# Patient Record
Sex: Male | Born: 1955 | Race: White | Hispanic: No | Smoking: Former smoker
Health system: Southern US, Community
[De-identification: ages and names within clinical notes are randomized; demographics above are authoritative.]

## PROBLEM LIST (undated history)

## (undated) DIAGNOSIS — I10 Essential (primary) hypertension: Secondary | ICD-10-CM

## (undated) HISTORY — PX: HAND SURGERY: SHX662

## (undated) HISTORY — PX: LEG SURGERY: SHX1003

## (undated) HISTORY — PX: HERNIA REPAIR: SHX51

## (undated) HISTORY — PX: MANDIBLE FRACTURE SURGERY: SHX706

## (undated) HISTORY — PX: OTHER SURGICAL HISTORY: SHX169

---

## 1999-03-02 ENCOUNTER — Encounter: Payer: Self-pay | Admitting: Emergency Medicine

## 1999-03-02 ENCOUNTER — Emergency Department (HOSPITAL_COMMUNITY): Admission: EM | Admit: 1999-03-02 | Discharge: 1999-03-02 | Payer: Self-pay | Admitting: Emergency Medicine

## 2000-05-02 ENCOUNTER — Other Ambulatory Visit: Admission: RE | Admit: 2000-05-02 | Discharge: 2000-05-02 | Payer: Self-pay | Admitting: Orthopedic Surgery

## 2001-07-05 ENCOUNTER — Ambulatory Visit (HOSPITAL_COMMUNITY): Admission: RE | Admit: 2001-07-05 | Discharge: 2001-07-05 | Payer: Self-pay | Admitting: Otolaryngology

## 2001-07-05 ENCOUNTER — Encounter: Payer: Self-pay | Admitting: Otolaryngology

## 2002-10-30 ENCOUNTER — Ambulatory Visit: Admission: RE | Admit: 2002-10-30 | Discharge: 2002-10-30 | Payer: Self-pay | Admitting: Orthopedic Surgery

## 2002-12-25 ENCOUNTER — Encounter (INDEPENDENT_AMBULATORY_CARE_PROVIDER_SITE_OTHER): Payer: Self-pay | Admitting: *Deleted

## 2002-12-25 ENCOUNTER — Observation Stay (HOSPITAL_COMMUNITY): Admission: RE | Admit: 2002-12-25 | Discharge: 2002-12-26 | Payer: Self-pay | Admitting: Orthopedic Surgery

## 2007-09-16 ENCOUNTER — Emergency Department (HOSPITAL_COMMUNITY): Admission: EM | Admit: 2007-09-16 | Discharge: 2007-09-17 | Payer: Self-pay | Admitting: Emergency Medicine

## 2010-12-31 NOTE — Op Note (Signed)
NAME:  Ronald Sosa, Ronald Sosa                       ACCOUNT NO.:  [id number]   MEDICAL RECORD NO.:  [id number]                   PATIENT TYPE:  OBV   LOCATION:  0454                                 FACILITY:  [facility]   PHYSICIAN:  Georges Lynch. Darrelyn Hillock, M.D.             DATE OF BIRTH:  05/22/1956   DATE OF PROCEDURE:  12/25/2002  DATE OF DISCHARGE:                                 OPERATIVE REPORT   SURGEON:  Georges Lynch. Darrelyn Hillock, M.D.   ASSISTANT:  Ebbie Ridge. Paitsel, P.A.   PREOPERATIVE DIAGNOSIS:  Ganglion cyst of the posterior leg proximal central  on the right.   POSTOPERATIVE DIAGNOSIS:  Ganglion cyst of the posterior leg proximal  central on the right.   OPERATION:  Exploration of the popliteal space and distal to the space down  into the soleus muscle. We were deep to the muscle and basically biopsies  were taken of the area.   DESCRIPTION OF PROCEDURE:  Under general anesthesia, routine orthopedic prep  and draping was carried out with the patient in prone position. No  tourniquet was used so that we could palpate the vessel. These cysts were  sort of septated and localized directly posterior to the tibial artery, vein  and nerve. We made the incision down between the two heads of the gastroc  muscle which started at the knee joint and dissected down distally. We  identified the tibial nerve, posterior tibial artery and vein and identified  all the branches of the tibial nerve. Great care was taken not to injure the  neurological structures. We went down distal to the soleus ring where the  artery goes down. We took biopsies of the area, also took biopsies of local  fat and sent those off to the lab. We thoroughly irrigated out the area,  closed the wound in layers in the usual fashion. Basically the cyst that he  had mostly likely was ruptured but there was no large masses there and went  all the way down to the mid calf region deep. We will go ahead and repeat an  MRI later to make  sure they do not fill again with fluid, if they do and if  he is symptomatic we may have to go back, but I doubt that. In fact, he said  that when I first saw him he could not flex his toes and has definitely  improved since the time of MRI. Basically we will keep him on antibiotics,  put him in the immobilizer and send him home tomorrow.                                               Ronald A. Darrelyn Hillock, M.D.    RAG/MEDQ  D:  12/25/2002  T:  12/26/2002  Job:  [id number]

## 2011-05-06 LAB — CBC
HCT: 32.6 — ABNORMAL LOW
Hemoglobin: 11 — ABNORMAL LOW
MCHC: 33.7
MCV: 84.2
Platelets: 318
RBC: 3.87 — ABNORMAL LOW
RDW: 14.9
WBC: 6.6

## 2011-05-06 LAB — DIFFERENTIAL
Basophils Absolute: 0.1
Basophils Relative: 1
Eosinophils Absolute: 0.3
Eosinophils Relative: 5
Lymphocytes Relative: 23
Lymphs Abs: 1.5
Monocytes Absolute: 0.9
Monocytes Relative: 14 — ABNORMAL HIGH
Neutro Abs: 3.8
Neutrophils Relative %: 58

## 2011-05-06 LAB — BASIC METABOLIC PANEL
BUN: 10
CO2: 28
Calcium: 9.1
Chloride: 105
Creatinine, Ser: 0.79
GFR calc Af Amer: >60
GFR calc non Af Amer: >60
Glucose, Bld: 128 — ABNORMAL HIGH
Potassium: 3.4 — ABNORMAL LOW
Sodium: 138

## 2011-05-06 LAB — POCT CARDIAC MARKERS
Myoglobin, poc: 181
Troponin i, poc: <0.05

## 2011-05-06 LAB — OCCULT BLOOD X 1 CARD TO LAB, STOOL: Fecal Occult Bld: NEGATIVE

## 2014-03-23 ENCOUNTER — Emergency Department (HOSPITAL_COMMUNITY)
Admission: EM | Admit: 2014-03-23 | Discharge: 2014-03-23 | Disposition: A | Discharge status: Home / Self Care | Payer: Managed Care, Other (non HMO) | Attending: Emergency Medicine | Admitting: Emergency Medicine

## 2014-03-23 ENCOUNTER — Encounter (HOSPITAL_COMMUNITY): Payer: Self-pay | Admitting: Emergency Medicine

## 2014-03-23 DIAGNOSIS — R030 Elevated blood-pressure reading, without diagnosis of hypertension: Secondary | ICD-10-CM | POA: Insufficient documentation

## 2014-03-23 DIAGNOSIS — Z87891 Personal history of nicotine dependence: Secondary | ICD-10-CM | POA: Insufficient documentation

## 2014-03-23 DIAGNOSIS — IMO0001 Reserved for inherently not codable concepts without codable children: Secondary | ICD-10-CM

## 2014-03-23 LAB — CBC WITH DIFFERENTIAL/PLATELET
BASOS PCT: 1 % (ref 0–1)
Basophils Absolute: 0 10*3/uL (ref 0.0–0.1)
EOS ABS: 0.2 10*3/uL (ref 0.0–0.7)
EOS PCT: 5 % (ref 0–5)
HCT: 44.6 % (ref 39.0–52.0)
Hemoglobin: 15.2 g/dL (ref 13.0–17.0)
LYMPHS ABS: 1.2 10*3/uL (ref 0.7–4.0)
Lymphocytes Relative: 24 % (ref 12–46)
MCH: 30.8 pg (ref 26.0–34.0)
MCHC: 34.1 g/dL (ref 30.0–36.0)
MCV: 90.5 fL (ref 78.0–100.0)
MONOS PCT: 15 % — AB (ref 3–12)
Monocytes Absolute: 0.8 10*3/uL (ref 0.1–1.0)
Neutro Abs: 2.8 10*3/uL (ref 1.7–7.7)
Neutrophils Relative %: 55 % (ref 43–77)
PLATELETS: 247 10*3/uL (ref 150–400)
RBC: 4.93 MIL/uL (ref 4.22–5.81)
RDW: 13.6 % (ref 11.5–15.5)
WBC: 5.1 10*3/uL (ref 4.0–10.5)

## 2014-03-23 LAB — COMPREHENSIVE METABOLIC PANEL
ALT: 9 U/L (ref 0–53)
ANION GAP: 10 (ref 5–15)
AST: 18 U/L (ref 0–37)
Albumin: 3.7 g/dL (ref 3.5–5.2)
Alkaline Phosphatase: 70 U/L (ref 39–117)
BUN: 15 mg/dL (ref 6–23)
CALCIUM: 9.6 mg/dL (ref 8.4–10.5)
CO2: 28 mEq/L (ref 19–32)
Chloride: 104 mEq/L (ref 96–112)
Creatinine, Ser: 0.76 mg/dL (ref 0.50–1.35)
GFR calc non Af Amer: >90 mL/min (ref >90)
GLUCOSE: 110 mg/dL — AB (ref 70–99)
Potassium: 4.1 mEq/L (ref 3.7–5.3)
SODIUM: 142 meq/L (ref 137–147)
TOTAL PROTEIN: 7.3 g/dL (ref 6.0–8.3)
Total Bilirubin: 0.6 mg/dL (ref 0.3–1.2)

## 2014-03-23 MED ORDER — CLONIDINE HCL 0.1 MG PO TABS
0.2000 mg | ORAL_TABLET | Freq: Once | ORAL | Status: AC
  Administered 2014-03-23: 0.2 mg via ORAL
  Filled 2014-03-23: qty 2

## 2014-03-23 MED ORDER — LISINOPRIL 5 MG PO TABS: 5.0000 mg | ORAL_TABLET | Freq: Every day | ORAL | Status: DC

## 2014-03-23 NOTE — ED Notes (Signed)
Pt arrived to the ED with a complaint of hypertension.  Pt states he was at the Nursing home visiting with his wife when he fell asleep.  Pt left Nursing facility around 0130 and didn't feel well.  Pt went to the for wound care when he was told his BP was elevated.  Pt took his blood pressure at home, it was elevated, he came to ED for evaluation.

## 2014-03-23 NOTE — Discharge Instructions (Signed)
Establish primary care M.D. to manage your blood pressure. Take medication as prescribed. Return immediately to the emergency apartment for vision changes, persistent vomiting, focal weakness or numbness or for any concerns.   Hypertension Hypertension, commonly called high blood pressure, is when the force of blood pumping through your arteries is too strong. Your arteries are the blood vessels that carry blood from your heart throughout your body. A blood pressure reading consists of a higher number over a lower number, such as 110/72. The higher number (systolic) is the pressure inside your arteries when your heart pumps. The lower number (diastolic) is the pressure inside your arteries when your heart relaxes. Ideally you want your blood pressure below 120/80. Hypertension forces your heart to work harder to pump blood. Your arteries may become narrow or stiff. Having hypertension puts you at risk for heart disease, stroke, and other problems.  RISK FACTORS Some risk factors for high blood pressure are controllable. Others are not.  Risk factors you cannot control include:   Race. You may be at higher risk if you are African American.  Age. Risk increases with age.  Gender. Men are at higher risk than women before age 46 years. After age 66, women are at higher risk than men. Risk factors you can control include:  Not getting enough exercise or physical activity.  Being overweight.  Getting too much fat, sugar, calories, or salt in your diet.  Drinking too much alcohol. SIGNS AND SYMPTOMS Hypertension does not usually cause signs or symptoms. Extremely high blood pressure (hypertensive crisis) may cause headache, anxiety, shortness of breath, and nosebleed. DIAGNOSIS  To check if you have hypertension, your health care provider will measure your blood pressure while you are seated, with your arm held at the level of your heart. It should be measured at least twice using the same arm.  Certain conditions can cause a difference in blood pressure between your right and left arms. A blood pressure reading that is higher than normal on one occasion does not mean that you need treatment. If one blood pressure reading is high, ask your health care provider about having it checked again. TREATMENT  Treating high blood pressure includes making lifestyle changes and possibly taking medicine. Living a healthy lifestyle can help lower high blood pressure. You may need to change some of your habits. Lifestyle changes may include:  Following the DASH diet. This diet is high in fruits, vegetables, and whole grains. It is low in salt, red meat, and added sugars.  Getting at least 2 hours of brisk physical activity every week.  Losing weight if necessary.  Not smoking.  Limiting alcoholic beverages.  Learning ways to reduce stress. If lifestyle changes are not enough to get your blood pressure under control, your health care provider may prescribe medicine. You may need to take more than one. Work closely with your health care provider to understand the risks and benefits. HOME CARE INSTRUCTIONS  Have your blood pressure rechecked as directed by your health care provider.   Take medicines only as directed by your health care provider. Follow the directions carefully. Blood pressure medicines must be taken as prescribed. The medicine does not work as well when you skip doses. Skipping doses also puts you at risk for problems.   Do not smoke.   Monitor your blood pressure at home as directed by your health care provider. SEEK MEDICAL CARE IF:   You think you are having a reaction to medicines taken.  You have recurrent headaches or feel dizzy.  You have swelling in your ankles.  You have trouble with your vision. SEEK IMMEDIATE MEDICAL CARE IF:  You develop a severe headache or confusion.  You have unusual weakness, numbness, or feel faint.  You have severe chest or  abdominal pain.  You vomit repeatedly.  You have trouble breathing. MAKE SURE YOU:   Understand these instructions.  Will watch your condition.  Will get help right away if you are not doing well or get worse. Document Released: 08/01/2005 Document Revised: 12/16/2013 Document Reviewed: 05/24/2013 [facility] Patient Information 2015 [place], [place]. This information is not intended to replace advice given to you by your health care provider. Make sure you discuss any questions you have with your health care provider.  Emergency Department Resource Guide 1) Find a Doctor and Pay Out of Pocket Although you won't have to find out who is covered by your insurance plan, it is a good idea to ask around and get recommendations. You will then need to call the office and see if the doctor you have chosen will accept you as a new patient and what types of options they offer for patients who are self-pay. Some doctors offer discounts or will set up payment plans for their patients who do not have insurance, but you will need to ask so you aren't surprised when you get to your appointment.  2) Contact Your Local Health Department Not all health departments have doctors that can see patients for sick visits, but many do, so it is worth a call to see if yours does. If you don't know where your local health department is, you can check in your phone book. The CDC also has a tool to help you locate your state's health department, and many state websites also have listings of all of their local health departments.  3) Find a [facility] If your illness is not likely to be very severe or complicated, you may want to try a walk in clinic. These are popping up all over the country in pharmacies, drugstores, and shopping centers. They're usually staffed by nurse practitioners or physician assistants that have been trained to treat common illnesses and complaints. They're usually fairly quick and inexpensive.  However, if you have serious medical issues or chronic medical problems, these are probably not your best option.  No Primary Care Doctor: - Call Health Connect at  [phone] - they can help you locate a primary care doctor that  accepts your insurance, provides certain services, etc. - Physician Referral Service- [phone]  Chronic Pain Problems: Organization         Address  Phone   Notes  [facility]  [phone] Patients need to be referred by their primary care doctor.   Medication Assistance: Organization         Address  Phone   Notes  [facility] Medication [facility] [place], [place] [address] [phone] --Must be a resident of [facility] -- Must have NO insurance coverage whatsoever (no Medicaid/ Medicare, etc.) -- The pt. MUST have a primary care doctor that directs their care regularly and follows them in the community   Med[facility]ist  [phone]   [employer]  [phone]    Agencies that provide inexpensive medical care: Organization         Address  Phone   Notes  [place]  [phone]  [facility]    [phone]   [place] [facility] [place], [place] [address] [phone]   [place]. [address], [place] [phone]   Planned Parenthood    [phone]   [facility]    [phone]   [place] and [place] [address], [place] Phone:  [phone], Fax:  [phone] Hours of Operation:  9 am - 6 pm, M-F.  Also accepts Medicaid/Medicare and self-pay.  [facility] for [address], [address], [place] Phone: [phone], Fax: [phone]. Hours of Operation:  8:30 am - 5:30 pm, M-F.  Also accepts Medicaid and self-pay.  [facility] [place] [address], [place] Phone: [phone]   [place], [place], [place] [phone], Ext. [phone] Mondays & Thursdays: 7-9 AM.  First 15 patients are seen on a first come, first serve basis.    [place] Providers:  Organization         Address  Phone   Notes  [facility] [address], [place] [phone] Also accepts self-pay patients.  [facility] [address], [place]  [phone]   [place], [address], [place] [phone]   [facility] [address], [place] [phone]   Lucianne Lei [address], [place]   [phone] Only accepts [place] Access [place] patients after they have their name applied to their card.   Self-Pay (no insurance) in [facility]:  Organization         Address  Phone   Notes  Sickle Cell Patients, [facility] [place] [phone]   [facility]gent Care [place] [phone]   [facility] [place]  [place], [place], [place] [phone]   [facility]/Dr. Osei-Bonsu  [address] [place] or [address], [place] [phone] Phone number for both [place] and [place] locations is the same.  Urgent Medical and [facility] [address], [place] [phone]   [facility] [address], [place] or [address] [phone]   [facility] [address], [place] [phone], phone; [phone], fax Sees patients 1st and 3rd Saturday of every month.  Must not qualify for public or private insurance (i.e. Medicaid, Medicare, [place] Health Choice, Veterans' Benefits)  Household income should be no more than 200% of the poverty level The clinic cannot treat you if you are pregnant or think  you are pregnant  Sexually transmitted diseases are not treated at the clinic.    Dental Care: Organization         Address  Phone  Notes  [facility] Department of [facility] [place] [phone] Accepts children up to age 60 who are enrolled in [place] or [place]; pregnant women with a Medicaid card; and children who have applied for Medicaid or [place] Health Choice, but were declined, whose parents can pay a reduced fee at time of service.  [facility] Department of [facility]  [address],  [place] [phone] Accepts children up to age 7 who are enrolled in Medicaid or [facility] Health Choice; pregnant women with a Medicaid card; and children who have applied for Medicaid or  Health Choice, but were declined, whose parents can pay a reduced fee at time of service.  [place] Adult Dental Access PROGRAM  [place] [phone] Patients are seen by appointment only. Walk-ins are not accepted. [place] will see patients 57 years of age and older. Monday - Tuesday (8am-5pm) Most Wednesdays (8:30-5pm) $30 per visit, cash only  [facility] Adult Dental Access PROGRAM  [address], [facility] [phone] Patients are seen by appointment only. Walk-ins are not accepted. [place] will see patients 20 years of age and older. One Wednesday Evening (Monthly: Volunteer Based).  $30 per visit, cash only  [place]  [phone] for adults; Children under age 67, call Graduate Pediatric Dentistry at [phone]. Children aged 53-14, please call [phone] to request a pediatric application.  Dental services are provided in all areas of dental care including fillings, crowns and bridges, complete and partial dentures, implants, gum treatment, root canals, and extractions. Preventive care is also provided. Treatment is provided to both adults and  children. Patients are selected via a lottery and there is often a waiting list.   [facility] [address], [place]  [phone] [email or web address]   [facility] [address] [place], [place] [phone], Ext. [phone] Second and Fourth Thursday of each month, opens at 6:30 AM; Clinic ends at 9 AM.  Patients are seen on a first-come first-served basis, and a limited number are seen during each clinic.   [facility]  [address] [place], [place] [phone]   Eligibility Requirements You must have lived in [place], [place], or [place] counties for at least the last three months.   You cannot be eligible for state or federal sponsored [employer], including [employer], [place], or [employer].   You generally cannot be eligible for healthcare insurance through your employer.    How to apply: Eligibility screenings are held every Tuesday and Wednesday afternoon from 1:00 pm until 4:00 pm. You do not need an appointment for the interview!  [facility] [address], [place], [place]   [place]  [place] Department  [place]  [phone]    Behavioral Health Resources in the Community: Intensive Outpatient Programs Organization         Address  Phone  Notes  [place]. [address] [place], [place] [phone]   [facility] Outpatient [address], [place], [place]   ADS: Alcohol & Drug Svcs [address] [place], [place]   [place] [address],  [place], [place] or [phone]   Substance Abuse Resources Organization         Address  Phone  Notes  Alcohol and Drug Services  [phone]   [place]  [phone]   [facility] [place]     Chinita Pester  [phone]   Residential & Outpatient Substance Abuse Program  [phone]   Psychological Services Organization         Address  Phone  Notes  [place]  [place]  [place] [address], [place] or [phone]    Mobile Crisis Teams Organization         Address  Phone  Notes  [facility], Mobile Crisis Care Unit  [phone]   [facility]ive Psychotherapeutic Services  [address] [place], [place]   [place] Levels [address] [place] [phone]    Self-Help/Support Groups Organization         Address  Phone             Notes  [place]. of [place] - variety of support groups  [place] Call for more information  Narcotics Anonymous (NA), Caring Services [address], [employer] [place]  2 meetings at this location   Special educational needs teacher         Address  Phone  Notes  [facility]AP Residential Treatment [place],    [place]  [phone]   [facility]  [address], [place] [phone], [place], [place]   [place], [place] [phone] Admissions: 8am-3pm M-F  Incentives Substance [place] [address]    [place], [place] [phone]   [facility] [address] [place], [place], [place]   [facility] [address],  [place], [place]   [facility] Programs - Intensive Outpatient [address], [place], [place]   [facility] [address].,  [place], [place] [phone] or [phone]   [facility]idential Treatment Services (RTS) [address], [place], [place] Accepts Medicaid  [facility] [place] [address]  [place] [phone] Substance Abuse/Addiction Treatment   [facility] Organization         Address  Phone  Notes  [facility]  [phone]   Domenic Schwab, PhD [address] Arlis [place], [place]   [phone] or [phone]   [place], [place] [phone]   [facility] [address] [place], [place] [phone] Insurance/Medicaid/sponsorship through [facility] and Families [address] [place]                                    [place], [place] [phone] [place] [address], [place] [phone]    Dr. Adele Schilder  [phone]   [facility] Dept. 1) [address], [place] 2) [place] 3)  [place] [address], [place] [phone]  [phone]   [place] [phone] or [phone] (After Hours)

## 2014-03-23 NOTE — ED Provider Notes (Signed)
CSN: [id number]     Arrival date & time 03/23/14  0413 History   First MD Initiated Contact with Patient 03/23/14 [phone]     Chief Complaint  Patient presents with  . Hypertension     (Consider location/radiation/quality/duration/timing/severity/associated sxs/prior Treatment) HPI Patient states that he was feeling mildly fatigued and took his blood pressure at home. Consistently reading high. He has no history of hypertension. He denies any headache. Denies any vision changes. He has no focal weakness or numbness. Denies any nausea or vomiting. History reviewed. No pertinent past medical history. History reviewed. No pertinent past surgical history. History reviewed. No pertinent family history. History  Substance Use Topics  . Smoking status: Former Research scientist (life sciences)  . Smokeless tobacco: Not on file  . Alcohol Use: No    Review of Systems  Constitutional: Positive for fatigue. Negative for fever and chills.  Eyes: Negative for visual disturbance.  Respiratory: Negative for shortness of breath.   Cardiovascular: Negative for chest pain.  Gastrointestinal: Negative for nausea, vomiting and abdominal pain.  Genitourinary: Negative for dysuria.  Musculoskeletal: Negative for back pain, neck pain and neck stiffness.  Skin: Negative for rash and wound.  Neurological: Negative for dizziness, weakness, light-headedness, numbness and headaches.  All other systems reviewed and are negative.     Allergies  Review of patient's allergies indicates not on file.  Home Medications   Prior to Admission medications   Not on File   BP 167/118  Pulse 98  Temp(Src) 98 F (36.7 C)  Resp 16  SpO2 97% Physical Exam  Nursing note and vitals reviewed. Constitutional: He is oriented to person, place, and time. He appears well-developed and well-nourished. No distress.  HENT:  Head: Normocephalic and atraumatic.  Mouth/Throat: Oropharynx is clear and moist.  Eyes: EOM are normal. Pupils are equal,  round, and reactive to light.  Neck: Normal range of motion. Neck supple.  Cardiovascular: Normal rate and regular rhythm.   Pulmonary/Chest: Effort normal and breath sounds normal. No respiratory distress. He has no wheezes. He has no rales. He exhibits no tenderness.  Abdominal: Soft. Bowel sounds are normal. He exhibits no distension and no mass. There is no tenderness. There is no rebound and no guarding.  Musculoskeletal: Normal range of motion. He exhibits no edema and no tenderness.  Neurological: He is alert and oriented to person, place, and time.  Patient is alert and oriented x3 with clear, goal oriented speech. Patient has 5/5 motor in all extremities. Sensation is intact to light touch. Bilateral finger-to-nose is normal with no signs of dysmetria. Patient has a normal gait and walks without assistance.    Skin: Skin is warm and dry. No rash noted. No erythema.  Psychiatric: He has a normal mood and affect. His behavior is normal.    ED Course  Procedures (including critical care time) Labs Review Labs Reviewed - No data to display  Imaging Review No results found.   EKG Interpretation None      MDM   Final diagnoses:  None     Patient's blood pressures improved. Basic laboratories without acute findings. Will start on low-dose lisinopril. He's been advised to establish care with a primary physician to followup blood pressure. Return precautions given.  Julianne Rice, MD 03/23/14 [phone]

## 2015-10-30 ENCOUNTER — Other Ambulatory Visit: Payer: Self-pay | Admitting: Urology

## 2015-10-30 DIAGNOSIS — R972 Elevated prostate specific antigen [PSA]: Secondary | ICD-10-CM

## 2015-11-19 ENCOUNTER — Ambulatory Visit (HOSPITAL_COMMUNITY)
Admission: RE | Admit: 2015-11-19 | Discharge: 2015-11-19 | Disposition: A | Discharge status: Home / Self Care | Payer: Managed Care, Other (non HMO) | Source: Ambulatory Visit | Attending: Urology | Admitting: Urology

## 2015-11-19 DIAGNOSIS — R972 Elevated prostate specific antigen [PSA]: Secondary | ICD-10-CM | POA: Insufficient documentation

## 2015-11-19 DIAGNOSIS — N402 Nodular prostate without lower urinary tract symptoms: Secondary | ICD-10-CM | POA: Insufficient documentation

## 2015-11-19 LAB — POCT I-STAT CREATININE: Creatinine, Ser: 0.6 mg/dL — ABNORMAL LOW (ref 0.61–1.24)

## 2015-11-19 MED ORDER — GADOBENATE DIMEGLUMINE 529 MG/ML IV SOLN
20.0000 mL | Freq: Once | INTRAVENOUS | Status: AC | PRN
  Administered 2015-11-19: 20 mL via INTRAVENOUS

## 2016-11-07 DIAGNOSIS — Z79899 Other long term (current) drug therapy: Secondary | ICD-10-CM | POA: Insufficient documentation

## 2016-11-07 DIAGNOSIS — Z8739 Personal history of other diseases of the musculoskeletal system and connective tissue: Secondary | ICD-10-CM | POA: Insufficient documentation

## 2016-11-07 DIAGNOSIS — M25522 Pain in left elbow: Secondary | ICD-10-CM | POA: Insufficient documentation

## 2016-11-07 DIAGNOSIS — Z8679 Personal history of other diseases of the circulatory system: Secondary | ICD-10-CM | POA: Insufficient documentation

## 2016-11-07 DIAGNOSIS — M170 Bilateral primary osteoarthritis of knee: Secondary | ICD-10-CM | POA: Insufficient documentation

## 2016-11-07 DIAGNOSIS — Z87828 Personal history of other (healed) physical injury and trauma: Secondary | ICD-10-CM | POA: Insufficient documentation

## 2016-11-07 DIAGNOSIS — S81802A Unspecified open wound, left lower leg, initial encounter: Secondary | ICD-10-CM | POA: Insufficient documentation

## 2016-11-07 DIAGNOSIS — M0609 Rheumatoid arthritis without rheumatoid factor, multiple sites: Secondary | ICD-10-CM | POA: Insufficient documentation

## 2016-11-07 DIAGNOSIS — M5136 Other intervertebral disc degeneration, lumbar region: Secondary | ICD-10-CM | POA: Insufficient documentation

## 2016-11-07 NOTE — Progress Notes (Signed)
Office Visit Note  Patient: Ronald Sosa             Date of Birth: [birth year]           MRN: [id number]             PCP: Pcp Not In System Referring: No ref. provider found Visit Date: 11/08/2016 Occupation: @GUAROCC @    Subjective:  Pain of the Left Hip (Worse with stairs / going up is very painful ) Left hip pain for the last 8-10 weeks. History of rheumatoid arthritis but stopped the Humira secondary to a dog bite. Last visit May 2016 at which time I gave him a third and final Euflex injection in both knees.  History of Present Illness: Ronald Sosa is a 61 y.o. male   Last visit May 2016. See above for full details for history of present illness. Requesting a cortisone injection left greater trochanter bursa.  Also complaining of left knee swelling off and on for the last couple of months. The left knee problem may have exacerbated the left hip problem.  Patient is off of Humira and methotrexate after being bitten by a dog and going through extensive treatment for the dog bite wound. Due to the dog bite, he was off of Humira and methotrexate. He never restarted. Doing relatively well but does have significant joint pain stiffness and swelling off and on.   Activities of Daily Living:  Patient reports morning stiffness for 30 - 90 minutes.   Patient Reports nocturnal pain.  Difficulty dressing/grooming: Denies Difficulty climbing stairs: Reports Difficulty getting out of chair: Reports Difficulty using hands for taps, buttons, cutlery, and/or writing: Reports   Review of Systems  Constitutional: Negative for fatigue.  HENT: Negative for mouth sores and mouth dryness.   Eyes: Negative for dryness.  Respiratory: Negative for shortness of breath.   Gastrointestinal: Negative for constipation and diarrhea.  Musculoskeletal: Negative for myalgias and myalgias.  Skin: Negative for sensitivity to sunlight.  Neurological: Negative for memory loss.    Psychiatric/Behavioral: Negative for sleep disturbance.    PMFS History:  Patient Active Problem List   Diagnosis Date Noted  . Rheumatoid arthritis of multiple sites with negative rheumatoid factor ([place]) 11/07/2016  . High risk medication use 11/07/2016  . Primary osteoarthritis of both knees 11/07/2016  . Arthralgia of left elbow 11/07/2016  . Leg wound, left, initial encounter 11/07/2016  . DDD (degenerative disc disease), lumbar 11/07/2016  . History of back strain 11/07/2016  . History of hypertension 11/07/2016    History reviewed. No pertinent past medical history.  No family history on file. History reviewed. No pertinent surgical history. Social History   Social History Narrative  . No narrative on file     Objective: Vital Signs: BP (!) 146/78   Pulse 78   Resp 16   Ht 5\' 10"  (1.778 m)   Wt 204 lb (92.5 kg)   BMI 29.27 kg/m    Physical Exam  Constitutional: He is oriented to person, place, and time. He appears well-developed and well-nourished.  HENT:  Head: Normocephalic and atraumatic.  Eyes: Conjunctivae and EOM are normal. Pupils are equal, round, and reactive to light.  Neck: Normal range of motion. Neck supple.  Cardiovascular: Normal rate, regular rhythm and normal heart sounds.  Exam reveals no gallop and no friction rub.   No murmur heard. Pulmonary/Chest: Effort normal and breath sounds normal. No respiratory distress. He has no wheezes. He has no rales. He  exhibits no tenderness.  Abdominal: Soft. He exhibits no distension and no mass. There is no tenderness. There is no guarding.  Musculoskeletal: Normal range of motion.  Lymphadenopathy:    He has no cervical adenopathy.  Neurological: He is alert and oriented to person, place, and time. He exhibits normal muscle tone. Coordination normal.  Skin: Skin is warm and dry. Capillary refill takes less than 2 seconds. No rash noted.  Psychiatric: He has a normal mood and affect. His behavior is  normal. Judgment and thought content normal.  Vitals reviewed.    Musculoskeletal Exam:  Full range of motion of all joints Grip strength is equal and strong bilaterally Fiber myalgia tender points are  CDAI Exam: CDAI Homunculus Exam:   Tenderness:  Right hand: 2nd MCP  Swelling:  Left hand: 2nd MCP  Joint Counts:  CDAI Tender Joint count: 1 CDAI Swollen Joint count: 1  Global Assessments:  Patient Global Assessment: 8 Provider Global Assessment: 8  CDAI Calculated Score: 18    Investigation: Findings:   Labs done on November 06, 2013 show CMP is normal, CBC is normal, and TB-Gold is negative.  No visits with results within 6 Month(s) from this visit.  Latest known visit with results is:  Hospital Outpatient Visit on 11/19/2015  Component Date Value Ref Range Status  . Creatinine, Ser 11/19/2015 0.60* 0.61 - 1.24 mg/dL Final      Imaging: No results found.  Speciality Comments: No specialty comments available.    Procedures:  Large Joint Inj Date/Time: 11/08/2016 9:41 AM Performed by: Eliezer Lofts Authorized by: Eliezer Lofts   Consent Given by:  Patient Site marked: the procedure site was marked   Timeout: prior to procedure the correct patient, procedure, and site was verified   Indications:  Pain Location:  Hip Site:  L greater trochanter Prep: patient was prepped and draped in usual sterile fashion   Needle Size:  27 G Approach:  Superior Ultrasound Guidance: No   Fluoroscopic Guidance: No   Arthrogram: No   Medications:  1.5 mL lidocaine 1 %; 40 mg triamcinolone acetonide 40 MG/ML Aspiration Attempted: Yes   Aspirate amount (mL):  0 Patient tolerance:  Patient tolerated the procedure well with no immediate complications  Left greater trochanter bursa pain for the last 8-10 weeks. No injury. History of bilateral knee osteoarthritis which did well after Euflex injections approximately May 2016. The left knee has been swelling up  some. The left hip may be exacerbated by the left knee discomfort.   Allergies: Patient has no known allergies.   Assessment / Plan:     Visit Diagnoses: Rheumatoid arthritis of multiple sites with negative rheumatoid factor (HCC) - HX of erosive disease  High risk medication use - off of methotrexate and Humira  s/p dog bite   Primary osteoarthritis of both knees  Arthralgia of left elbow  Leg wound, left, initial encounter - lateral  DDD (degenerative disc disease), lumbar  History of back strain  History of hypertension  Greater trochanteric bursitis of left hip   Plan: #1: Rheumatoid arthritis. Second MCP bilaterally with synovial thickening. Patient does have pain and discomfort as he uses his various joints. He works for [employer]  #2: High-risk prescription. Was on Humira but stopped after getting a dog bite. See srs  for full details Off of methotrexate.  #3: Left greater trochanter bursa. Patient has been having pain for the last 8-10 weeks. It may have been exacerbated by the left knee swelling  and pain and discomfort. I gave the patient injection of 40 mg of Kenalog mixed with one half mL's 1% lidocaine. See procedure note for full details.  #4: Bilateral knee joint pain. Apply for Euflex or Hyalgan or Orthovisc for treatment of both knees. Patient is agreeable.  #5: Return to clinic for ultrasound of bilateral hands and wrist in a few weeks. Patient was requested to be off of steroids prior to the procedure to evaluate for synovitis since she's been off of Humira and methotrexate. Examination shows bilateral second MCP with synovial thickening. Patient does have joint pain swelling and discomfort but is getting benefit from over-the-counter Tylenol/Advil when needed  #6: Return to clinic in 5 months for regular follow-up; return to clinic as scheduled for ultrasound; return to clinic for knee injections when scheduled    Orders: Orders Placed  This Encounter  Procedures  . Large Joint Injection/Arthrocentesis   No orders of the defined types were placed in this encounter.   Face-to-face time spent with patient was 30 minutes. 50% of time was spent in counseling and coordination of care.  Follow-Up Instructions: Return in about 5 months (around 04/10/2017) for RA (no tx since dog bite) // stopped humira // lt gr tr br --> kenalog  // lt knee pain // restart .   Eliezer Lofts, PA-C  Note - This record has been created using [employer].  Chart creation errors have been sought, but may not always  have been located. Such creation errors do not reflect on  the standard of medical care.

## 2016-11-08 ENCOUNTER — Encounter: Payer: Self-pay | Admitting: Rheumatology

## 2016-11-08 ENCOUNTER — Ambulatory Visit (INDEPENDENT_AMBULATORY_CARE_PROVIDER_SITE_OTHER): Payer: Managed Care, Other (non HMO) | Admitting: Rheumatology

## 2016-11-08 VITALS — BP 146/78 | HR 78 | Resp 16 | Ht 70.0 in | Wt 204.0 lb

## 2016-11-08 DIAGNOSIS — M7062 Trochanteric bursitis, left hip: Secondary | ICD-10-CM

## 2016-11-08 DIAGNOSIS — Z87828 Personal history of other (healed) physical injury and trauma: Secondary | ICD-10-CM

## 2016-11-08 DIAGNOSIS — M5136 Other intervertebral disc degeneration, lumbar region: Secondary | ICD-10-CM

## 2016-11-08 DIAGNOSIS — M0609 Rheumatoid arthritis without rheumatoid factor, multiple sites: Secondary | ICD-10-CM

## 2016-11-08 DIAGNOSIS — Z79899 Other long term (current) drug therapy: Secondary | ICD-10-CM | POA: Diagnosis not present

## 2016-11-08 DIAGNOSIS — S81802A Unspecified open wound, left lower leg, initial encounter: Secondary | ICD-10-CM

## 2016-11-08 DIAGNOSIS — Z8679 Personal history of other diseases of the circulatory system: Secondary | ICD-10-CM | POA: Diagnosis not present

## 2016-11-08 DIAGNOSIS — M25522 Pain in left elbow: Secondary | ICD-10-CM | POA: Diagnosis not present

## 2016-11-08 DIAGNOSIS — Z8739 Personal history of other diseases of the musculoskeletal system and connective tissue: Secondary | ICD-10-CM | POA: Diagnosis not present

## 2016-11-08 DIAGNOSIS — M170 Bilateral primary osteoarthritis of knee: Secondary | ICD-10-CM

## 2016-11-08 MED ORDER — LIDOCAINE HCL 1 % IJ SOLN
1.5000 mL | INTRAMUSCULAR | Status: AC | PRN
  Administered 2016-11-08: 1.5 mL

## 2016-11-08 MED ORDER — TRIAMCINOLONE ACETONIDE 40 MG/ML IJ SUSP
40.0000 mg | INTRAMUSCULAR | Status: AC | PRN
  Administered 2016-11-08: 40 mg via INTRA_ARTICULAR

## 2016-12-15 ENCOUNTER — Encounter (HOSPITAL_COMMUNITY): Payer: Self-pay | Admitting: Emergency Medicine

## 2016-12-15 ENCOUNTER — Emergency Department (HOSPITAL_COMMUNITY): Payer: Managed Care, Other (non HMO)

## 2016-12-15 ENCOUNTER — Emergency Department (HOSPITAL_COMMUNITY)
Admission: EM | Admit: 2016-12-15 | Discharge: 2016-12-15 | Disposition: A | Discharge status: Home / Self Care | Payer: Managed Care, Other (non HMO) | Attending: Emergency Medicine | Admitting: Emergency Medicine

## 2016-12-15 DIAGNOSIS — K5730 Diverticulosis of large intestine without perforation or abscess without bleeding: Secondary | ICD-10-CM | POA: Insufficient documentation

## 2016-12-15 DIAGNOSIS — R1031 Right lower quadrant pain: Secondary | ICD-10-CM | POA: Diagnosis present

## 2016-12-15 DIAGNOSIS — N202 Calculus of kidney with calculus of ureter: Secondary | ICD-10-CM | POA: Diagnosis not present

## 2016-12-15 DIAGNOSIS — Z87891 Personal history of nicotine dependence: Secondary | ICD-10-CM | POA: Diagnosis not present

## 2016-12-15 DIAGNOSIS — R110 Nausea: Secondary | ICD-10-CM | POA: Diagnosis not present

## 2016-12-15 DIAGNOSIS — I10 Essential (primary) hypertension: Secondary | ICD-10-CM | POA: Insufficient documentation

## 2016-12-15 DIAGNOSIS — N200 Calculus of kidney: Secondary | ICD-10-CM

## 2016-12-15 DIAGNOSIS — N211 Calculus in urethra: Secondary | ICD-10-CM

## 2016-12-15 DIAGNOSIS — Z79899 Other long term (current) drug therapy: Secondary | ICD-10-CM | POA: Diagnosis not present

## 2016-12-15 DIAGNOSIS — N201 Calculus of ureter: Secondary | ICD-10-CM

## 2016-12-15 HISTORY — DX: Essential (primary) hypertension: I10

## 2016-12-15 LAB — URINALYSIS, ROUTINE W REFLEX MICROSCOPIC
BACTERIA UA: NONE SEEN
Bilirubin Urine: NEGATIVE
Glucose, UA: NEGATIVE mg/dL
Ketones, ur: NEGATIVE mg/dL
Leukocytes, UA: NEGATIVE
Nitrite: NEGATIVE
PROTEIN: 30 mg/dL — AB
SQUAMOUS EPITHELIAL / LPF: NONE SEEN
Specific Gravity, Urine: 1.024 (ref 1.005–1.030)
WBC, UA: NONE SEEN WBC/hpf (ref 0–5)
pH: 7 (ref 5.0–8.0)

## 2016-12-15 LAB — CBC
HCT: 47.9 % (ref 39.0–52.0)
Hemoglobin: 16 g/dL (ref 13.0–17.0)
MCH: 30.5 pg (ref 26.0–34.0)
MCHC: 33.4 g/dL (ref 30.0–36.0)
MCV: 91.4 fL (ref 78.0–100.0)
PLATELETS: 302 10*3/uL (ref 150–400)
RBC: 5.24 MIL/uL (ref 4.22–5.81)
RDW: 13.5 % (ref 11.5–15.5)
WBC: 6.6 10*3/uL (ref 4.0–10.5)

## 2016-12-15 LAB — COMPREHENSIVE METABOLIC PANEL
ALBUMIN: 4 g/dL (ref 3.5–5.0)
ALT: 19 U/L (ref 17–63)
AST: 27 U/L (ref 15–41)
Alkaline Phosphatase: 70 U/L (ref 38–126)
Anion gap: 9 (ref 5–15)
BILIRUBIN TOTAL: 1.4 mg/dL — AB (ref 0.3–1.2)
BUN: 14 mg/dL (ref 6–20)
CHLORIDE: 106 mmol/L (ref 101–111)
CO2: 23 mmol/L (ref 22–32)
CREATININE: 0.92 mg/dL (ref 0.61–1.24)
Calcium: 9.3 mg/dL (ref 8.9–10.3)
GFR calc Af Amer: >60 mL/min (ref >60)
GFR calc non Af Amer: >60 mL/min (ref >60)
GLUCOSE: 168 mg/dL — AB (ref 65–99)
Potassium: 3.5 mmol/L (ref 3.5–5.1)
Sodium: 138 mmol/L (ref 135–145)
Total Protein: 7 g/dL (ref 6.5–8.1)

## 2016-12-15 LAB — LIPASE, BLOOD: Lipase: 22 U/L (ref 11–51)

## 2016-12-15 MED ORDER — TAMSULOSIN HCL 0.4 MG PO CAPS: 0.4000 mg | ORAL_CAPSULE | Freq: Every day | ORAL | 0 refills | Status: DC

## 2016-12-15 MED ORDER — ONDANSETRON HCL 4 MG PO TABS: 4.0000 mg | ORAL_TABLET | Freq: Four times a day (QID) | ORAL | 0 refills | Status: DC

## 2016-12-15 MED ORDER — HYDROCODONE-ACETAMINOPHEN 5-325 MG PO TABS: 2.0000 | ORAL_TABLET | ORAL | 0 refills | Status: DC | PRN

## 2016-12-15 MED ORDER — IOPAMIDOL (ISOVUE-300) INJECTION 61%
INTRAVENOUS | Status: AC
  Administered 2016-12-15: 100 mL
  Filled 2016-12-15: qty 100

## 2016-12-15 MED ORDER — ONDANSETRON HCL 4 MG/2ML IJ SOLN
4.0000 mg | Freq: Once | INTRAMUSCULAR | Status: DC | PRN
  Filled 2016-12-15: qty 2

## 2016-12-15 MED ORDER — IBUPROFEN 800 MG PO TABS: 800.0000 mg | ORAL_TABLET | Freq: Three times a day (TID) | ORAL | 0 refills | Status: AC

## 2016-12-15 MED ORDER — MORPHINE SULFATE (PF) 4 MG/ML IV SOLN: 4.0000 mg | Freq: Once | INTRAVENOUS | Status: DC | PRN

## 2016-12-15 MED ORDER — KETOROLAC TROMETHAMINE 30 MG/ML IJ SOLN
30.0000 mg | Freq: Once | INTRAMUSCULAR | Status: AC
  Administered 2016-12-15: 30 mg via INTRAVENOUS
  Filled 2016-12-15: qty 1

## 2016-12-15 NOTE — ED Provider Notes (Signed)
[name] Provider Note   CSN: [id number] Arrival date & time: 12/15/16  4097     History   Chief Complaint Chief Complaint  Patient presents with  . Abdominal Pain    HPI Ronald Sosa is a 61 y.o. male.  HPI  This morning he experienced a sudden onset of right lower quadrant abdominal pain.  He reports the pain, when present is 10/10, says he couldn't get comfortable.  The pain is sharp, does not radiate and has been intermittent but never completely going away.  He is unable to figure out why his pain gets better/worse.  No recent illness.  No blood in his urine, changes in urination.  No back pain.  No catheter use.  History of enlarged prostate.  He has no history of kidney stones or kidney problems.  He has no history of abdominal surgeries or problems.    Past Medical History:  Diagnosis Date  . Hypertension     Patient Active Problem List   Diagnosis Date Noted  . Diverticula of colon 12/15/2016  . Rheumatoid arthritis of multiple sites with negative rheumatoid factor ([place]) 11/07/2016  . High risk medication use 11/07/2016  . Primary osteoarthritis of both knees 11/07/2016  . Arthralgia of left elbow 11/07/2016  . Leg wound, left, initial encounter 11/07/2016  . DDD (degenerative disc disease), lumbar 11/07/2016  . History of back strain 11/07/2016  . History of hypertension 11/07/2016    History reviewed. No pertinent surgical history.     Home Medications    Prior to Admission medications   Medication Sig Start Date End Date Taking? Authorizing Provider  acetaminophen (TYLENOL) 500 MG tablet Take 500 mg by mouth every 6 (six) hours as needed (for pain.).   Yes Historical Provider, MD  Ginger, Zingiber officinalis, (GINGER PO) Take by mouth.   Yes Historical Provider, MD  lisinopril (PRINIVIL,ZESTRIL) 10 MG tablet Take 10 mg by mouth daily.  10/23/16  Yes Historical Provider, MD  Misc Natural Products (TART CHERRY ADVANCED PO) Take by mouth.    Yes Historical Provider, MD  Multiple Vitamin (MULTIVITAMIN WITH MINERALS) TABS tablet Take 1 tablet by mouth daily.   Yes Historical Provider, MD  Omega-3 Fatty Acids (FISH OIL) 1000 MG CAPS Take by mouth.   Yes Historical Provider, MD  TURMERIC PO Take by mouth.   Yes Historical Provider, MD  HYDROcodone-acetaminophen (NORCO/VICODIN) 5-325 MG tablet Take 2 tablets by mouth every 4 (four) hours as needed. 12/15/16   Lorin Glass, PA-C  ibuprofen (ADVIL,MOTRIN) 800 MG tablet Take 1 tablet (800 mg total) by mouth 3 (three) times daily. 12/15/16   Lorin Glass, PA-C  ondansetron (ZOFRAN) 4 MG tablet Take 1 tablet (4 mg total) by mouth every 6 (six) hours. 12/15/16   Lorin Glass, PA-C  tamsulosin (FLOMAX) 0.4 MG CAPS capsule Take 1 capsule (0.4 mg total) by mouth daily. 12/15/16   Lorin Glass, PA-C    Family History No family history on file.  Social History Social History  Substance Use Topics  . Smoking status: Former Smoker    Quit date: 08/15/1976  . Smokeless tobacco: Never Used  . Alcohol use No     Allergies   Patient has no known allergies.   Review of Systems Review of Systems  Constitutional: Negative for chills and fever.  HENT: Negative for ear pain and sore throat.   Eyes: Negative for pain and visual disturbance.  Respiratory: Negative for cough and shortness of breath.  Cardiovascular: Negative for chest pain and palpitations.  Gastrointestinal: Positive for abdominal pain and nausea (When pain is present). Negative for anal bleeding, blood in stool, diarrhea and vomiting.  Genitourinary: Negative for decreased urine volume, difficulty urinating, discharge, dysuria, flank pain, frequency, hematuria, penile pain, penile swelling, scrotal swelling and urgency.  Musculoskeletal: Negative for arthralgias and back pain.  Skin: Negative for color change and rash.  Neurological: Negative for seizures and syncope.  All other systems reviewed and are  negative.    Physical Exam Updated Vital Signs BP 112/75   Pulse 74   Temp 97.5 F (36.4 C) (Oral)   Resp 17   SpO2 100%   Physical Exam  Constitutional: He appears well-developed and well-nourished. No distress.  HENT:  Head: Normocephalic and atraumatic.  Right Ear: External ear normal.  Left Ear: External ear normal.  Nose: Nose normal.  Mouth/Throat: Oropharynx is clear and moist.  Eyes: Conjunctivae and EOM are normal. Pupils are equal, round, and reactive to light. No scleral icterus.  Neck: Normal range of motion. Neck supple. No JVD present. No tracheal deviation present.  Cardiovascular: Normal rate, regular rhythm, normal heart sounds and intact distal pulses.  Exam reveals no gallop and no friction rub.   No murmur heard. Pulmonary/Chest: Effort normal and breath sounds normal. No respiratory distress. He has no wheezes.  Abdominal: Soft. Bowel sounds are normal. He exhibits no distension and no mass. There is tenderness (2/10, RLQ). There is no rigidity, no rebound, no guarding, no CVA tenderness and no tenderness at McBurney's point. No hernia.  Musculoskeletal: He exhibits no deformity.  Lymphadenopathy:    He has no cervical adenopathy.  Neurological: He is alert. No cranial nerve deficit. He exhibits normal muscle tone.  Skin: Skin is warm and dry. He is not diaphoretic.  Psychiatric: He has a normal mood and affect. His behavior is normal.  Nursing note and vitals reviewed.    ED Treatments / Results  Labs (all labs ordered are listed, but only abnormal results are displayed) Labs Reviewed  COMPREHENSIVE METABOLIC PANEL - Abnormal; Notable for the following:       Result Value   Glucose, Bld 168 (*)    Total Bilirubin 1.4 (*)    All other components within normal limits  URINALYSIS, ROUTINE W REFLEX MICROSCOPIC - Abnormal; Notable for the following:    Color, Urine AMBER (*)    APPearance CLOUDY (*)    Hgb urine dipstick LARGE (*)    Protein, ur 30  (*)    All other components within normal limits  LIPASE, BLOOD  CBC    EKG  EKG Interpretation None       Radiology Ct Abdomen Pelvis W Contrast  Result Date: 12/15/2016 CLINICAL DATA:  RLQ pain since early this morning, nausea, denies fever, no surgeries EXAM: CT ABDOMEN AND PELVIS WITH CONTRAST TECHNIQUE: Multidetector CT imaging of the abdomen and pelvis was performed using the standard protocol following bolus administration of intravenous contrast. CONTRAST:  ISOVUE-300 IOPAMIDOL (ISOVUE-300) INJECTION 61% COMPARISON:  None. FINDINGS: Lower chest: There is dependent change at both lung bases. No focal consolidations or suspicious pulmonary nodules. Coronary artery calcifications are identified. Heart size is normal. No pericardial effusion. Hepatobiliary: No focal liver abnormality is seen. No gallstones, gallbladder wall thickening, or biliary dilatation. Pancreas: Unremarkable. No pancreatic ductal dilatation or surrounding inflammatory changes. Spleen: Normal in appearance. Adrenals/Urinary Tract: Adrenals are normal in appearance. A nonobstructing intrarenal stone is identified in the lower pole the left  kidney measuring 2.5 mm. No renal mass. There is right-sided hydronephrosis and right hydroureter secondary to a mid ureteral calculus measuring 5 mm at the level of L5. Urinary bladder is normal in appearance. There are prostatic calcifications. A calcification measuring 4 mm projects in the region of the prostatic urethra. This could represent a prostatic calcification or a urethral stone. Prostate is mildly enlarged. Stomach/Bowel: The stomach and small bowel loops are normal in appearance. There are scattered colonic diverticula but no acute diverticulitis. The appendix is well seen and has a normal appearance. Vascular/Lymphatic: There is atherosclerotic calcification of the aorta and iliac arteries. Reproductive: All prostatic enlargement and calcifications. Possible prostatic urethral  stone measuring 4 mm. See above. Other: Small bilateral fat containing inguinal hernias are present. Musculoskeletal: Degenerative changes are seen in the lower thoracic and lumbar spine. No suspicious lytic or blastic lesions are identified. IMPRESSION: 1. Obstructing 5 mm calculus within the right ureter at the level of L5. 2. A second possible calculus in the prostatic urethra measuring 4 mm versus prostatic calcification. 3. Prostatic enlargement. 4. Coronary artery disease. 5. Mild prostatic enlargement. 6. Small bilateral fat containing inguinal hernias. Electronically Signed   By: Nolon Nations M.D.   On: 12/15/2016 11:01    Procedures Procedures (including critical care time)  Medications Ordered in ED Medications  morphine 4 MG/ML injection 4 mg (not administered)  ondansetron (ZOFRAN) injection 4 mg (not administered)  ketorolac (TORADOL) 30 MG/ML injection 30 mg (30 mg Intravenous Given 12/15/16 0957)  iopamidol (ISOVUE-300) 61 % injection (100 mLs  Contrast Given 12/15/16 1043)     Initial Impression / Assessment and Plan / ED Course  I have reviewed the triage vital signs and the nursing notes.  Pertinent labs & imaging results that were available during my care of the patient were reviewed by me and considered in my medical decision making (see chart for details).  Clinical Course as of Dec 15 1208  Thu Dec 15, 2016  1123 Patient re-checked and informed of CT results.  Given option to ask questions.  Pain currently 0/10  [EH]    Clinical Course User Index [EH] Lorin Glass, PA-C   Pt has been diagnosed with a Kidney Stone via CT. There is no evidence of severe hydronephrosis, serum creatine WNL, vitals sign stable and the pt does not have irratractable vomiting. Pt will be dc home with pain medications & has been advised to follow up with his urologist. At time of discharge patient is asymptomatic with 0/10 pain.  He was given the option to ask questions which were  answered to his satisfaction.  He does not have symptoms or lab findings consistent with infection.  He has established care with a urologist for prostate concerns and says he will see him for this stone.    Final Clinical Impressions(s) / ED Diagnoses   Final diagnoses:  Right inguinal pain  Right ureteral calculus  Calculus of left kidney  Diverticula of colon  Urethral calculus  Nausea    New Prescriptions New Prescriptions   HYDROCODONE-ACETAMINOPHEN (NORCO/VICODIN) 5-325 MG TABLET    Take 2 tablets by mouth every 4 (four) hours as needed.   IBUPROFEN (ADVIL,MOTRIN) 800 MG TABLET    Take 1 tablet (800 mg total) by mouth 3 (three) times daily.   ONDANSETRON (ZOFRAN) 4 MG TABLET    Take 1 tablet (4 mg total) by mouth every 6 (six) hours.   TAMSULOSIN (FLOMAX) 0.4 MG CAPS CAPSULE  Take 1 capsule (0.4 mg total) by mouth daily.     Lorin Glass, PA-C 12/15/16 1211    Nat Christen, MD 12/17/16 1025

## 2016-12-15 NOTE — ED Notes (Addendum)
Pt wheeled to room from waiting area. Pt assisted to restroom/ pt then assisted nito gown and onto bp and o2 monitor.

## 2016-12-15 NOTE — ED Notes (Signed)
Patient placed on 2 LO2 while sleeping.

## 2016-12-15 NOTE — ED Triage Notes (Signed)
Pt reports RLQ pain that began this am, denies n/v/d. States he had a bowel movement this am but pain did not improve.

## 2016-12-15 NOTE — ED Notes (Signed)
Patient given strainer and educated to strain all urine.

## 2016-12-28 NOTE — Progress Notes (Signed)
Ultrasound examination of bilateral hands was performed per EULAR  recommendations. Using 12 MHz transducer, grayscale and power Doppler  bilateral second, third, and fifth MCP joints and bilateral wrist joints  both dorsal and volar aspects were evaluated to look for synovitis or  tenosynovitis. The findings were there was mild synovitis in bilateral  second and bilateral third MCP joints and old erosion was noted in the  right fifth MCP joint. Synovitis was noted in the right wrist joint with  an old ulnar styloid erosion on ultrasound examination. Right median  nerve was 0.10 cm squares which was within normal limits and left median  nerve was 0.14 cm squares which was upper limits of normal.  Impression: Ultrasound examination mild synovitis over her MCP joints and  synovitis in the right wrist joint.  Patient has been off Humira for 3 years now. I discussed going back on methotrexate as he does have mild synovitis. At this point he would like to think about it and will let us know. He states he will schedule a follow-up appointment. I've given him a handout on methotrexate. He is also complaining of left IT band discomfort. He had a cortisone injection recently which has worn out. I've given him a handout on IT band exercises.   Bo Merino, MD, [facility]

## 2017-01-02 ENCOUNTER — Telehealth (INDEPENDENT_AMBULATORY_CARE_PROVIDER_SITE_OTHER): Payer: Self-pay | Admitting: Radiology

## 2017-01-02 NOTE — Telephone Encounter (Signed)
FW: Apply Euflex of both knees 3  Received: 1 week ago  Message Contents  Shona Needles, RT  Angelynn Lemus, RT      Previous Messages    ----- Message -----  From: Eliezer Lofts, PA-C  Sent: 11/08/2016  9:25 AM  To: Shona Needles, RT  Subject: Apply Euflex of both knees 3           Apply Euflex of both knees 3   Last Euflexxa completed Evan Osburn 2016  Patient is starting to have bilateral knee pain with left one swelling up recently  Did well with the Euflexxa in the past    Faxed in for VOB on Euflexxa, will followup.

## 2017-01-03 NOTE — Telephone Encounter (Signed)
Now submitted online, IC and they never received our fax, will followup.

## 2017-01-03 NOTE — Telephone Encounter (Signed)
[employer] PA needed, I faxed PA request in today.  Covers at 100% for buy and bill. Will followup on this.

## 2017-01-04 ENCOUNTER — Ambulatory Visit (INDEPENDENT_AMBULATORY_CARE_PROVIDER_SITE_OTHER): Payer: Self-pay | Admitting: Rheumatology

## 2017-01-04 ENCOUNTER — Inpatient Hospital Stay (INDEPENDENT_AMBULATORY_CARE_PROVIDER_SITE_OTHER): Payer: Managed Care, Other (non HMO)

## 2017-01-04 ENCOUNTER — Encounter (INDEPENDENT_AMBULATORY_CARE_PROVIDER_SITE_OTHER): Payer: Self-pay

## 2017-01-04 DIAGNOSIS — M79641 Pain in right hand: Secondary | ICD-10-CM | POA: Diagnosis not present

## 2017-01-04 DIAGNOSIS — M79642 Pain in left hand: Secondary | ICD-10-CM

## 2017-01-04 NOTE — Telephone Encounter (Signed)
I s/w Jeani Hawking at [facility], verified clinical info, she will send to process and they will let us know if authorized.

## 2017-01-04 NOTE — Patient Instructions (Signed)
Methotrexate tablets What is this medicine? METHOTREXATE (METH oh TREX ate) is a chemotherapy drug used to treat cancer including breast cancer, leukemia, and lymphoma. This medicine can also be used to treat psoriasis and certain kinds of arthritis. This medicine may be used for other purposes; ask your health care provider or pharmacist if you have questions. COMMON BRAND NAME(S): Rheumatrex, Trexall What should I tell my health care provider before I take this medicine? They need to know if you have any of these conditions: -fluid in the stomach area or lungs -if you often drink alcohol -infection or immune system problems -kidney disease or on hemodialysis -liver disease -low blood counts, like low white cell, platelet, or red cell counts -lung disease -radiation therapy -stomach ulcers -ulcerative colitis -an unusual or allergic reaction to methotrexate, other medicines, foods, dyes, or preservatives -pregnant or trying to get pregnant -breast-feeding How should I use this medicine? Take this medicine by mouth with a glass of water. Follow the directions on the prescription label. Take your medicine at regular intervals. Do not take it more often than directed. Do not stop taking except on your doctor's advice. Make sure you know why you are taking this medicine and how often you should take it. If this medicine is used for a condition that is not cancer, like arthritis or psoriasis, it should be taken weekly, NOT daily. Taking this medicine more often than directed can cause serious side effects, even death. Talk to your healthcare provider about safe handling and disposal of this medicine. You may need to take special precautions. Talk to your pediatrician regarding the use of this medicine in children. While this drug may be prescribed for selected conditions, precautions do apply. Overdosage: If you think you have taken too much of this medicine contact a poison control center or  emergency room at once. NOTE: This medicine is only for you. Do not share this medicine with others. What if I miss a dose? If you miss a dose, talk with your doctor or health care professional. Do not take double or extra doses. What may interact with this medicine? This medicine may interact with the following medication: -acitretin -aspirin and aspirin-like medicines including salicylates -azathioprine -certain antibiotics like penicillins, tetracycline, and chloramphenicol -cyclosporine -gold -hydroxychloroquine -live virus vaccines -NSAIDs, medicines for pain and inflammation, like ibuprofen or naproxen -other cytotoxic agents -penicillamine -phenylbutazone -phenytoin -probenecid -retinoids such as isotretinoin and tretinoin -steroid medicines like prednisone or cortisone -sulfonamides like sulfasalazine and trimethoprim/sulfamethoxazole -theophylline This list may not describe all possible interactions. Give your health care provider a list of all the medicines, herbs, non-prescription drugs, or dietary supplements you use. Also tell them if you smoke, drink alcohol, or use illegal drugs. Some items may interact with your medicine. What should I watch for while using this medicine? Avoid alcoholic drinks. This medicine can make you more sensitive to the sun. Keep out of the sun. If you cannot avoid being in the sun, wear protective clothing and use sunscreen. Do not use sun lamps or tanning beds/booths. You may need blood work done while you are taking this medicine. Call your doctor or health care professional for advice if you get a fever, chills or sore throat, or other symptoms of a cold or flu. Do not treat yourself. This drug decreases your body's ability to fight infections. Try to avoid being around people who are sick. This medicine may increase your risk to bruise or bleed. Call your doctor or health care professional   if you notice any unusual bleeding. Check with your  doctor or health care professional if you get an attack of severe diarrhea, nausea and vomiting, or if you sweat a lot. The loss of too much body fluid can make it dangerous for you to take this medicine. Talk to your doctor about your risk of cancer. You may be more at risk for certain types of cancers if you take this medicine. Both men and women must use effective birth control with this medicine. Do not become pregnant while taking this medicine or until at least 1 normal menstrual cycle has occurred after stopping it. Women should inform their doctor if they wish to become pregnant or think they might be pregnant. Men should not father a child while taking this medicine and for 3 months after stopping it. There is a potential for serious side effects to an unborn child. Talk to your health care professional or pharmacist for more information. Do not breast-feed an infant while taking this medicine. What side effects may I notice from receiving this medicine? Side effects that you should report to your doctor or health care professional as soon as possible: -allergic reactions like skin rash, itching or hives, swelling of the face, lips, or tongue -breathing problems or shortness of breath -diarrhea -dry, nonproductive cough -low blood counts - this medicine may decrease the number of white blood cells, red blood cells and platelets. You may be at increased risk for infections and bleeding. -mouth sores -redness, blistering, peeling or loosening of the skin, including inside the mouth -signs of infection - fever or chills, cough, sore throat, pain or trouble passing urine -signs and symptoms of bleeding such as bloody or black, tarry stools; red or dark-brown urine; spitting up blood or brown material that looks like coffee grounds; red spots on the skin; unusual bruising or bleeding from the eye, gums, or nose -signs and symptoms of kidney injury like trouble passing urine or change in the amount  of urine -signs and symptoms of liver injury like dark yellow or brown urine; general ill feeling or flu-like symptoms; light-colored stools; loss of appetite; nausea; right upper belly pain; unusually weak or tired; yellowing of the eyes or skin Side effects that usually do not require medical attention (report to your doctor or health care professional if they continue or are bothersome): -dizziness -hair loss -tiredness -upset stomach -vomiting This list may not describe all possible side effects. Call your doctor for medical advice about side effects. You may report side effects to FDA at 1-800-FDA-1088. Where should I keep my medicine? Keep out of the reach of children. Store at room temperature between 20 and 25 degrees C (68 and 77 degrees F). Protect from light. Throw away any unused medicine after the expiration date. NOTE: This sheet is a summary. It may not cover all possible information. If you have questions about this medicine, talk to your doctor, pharmacist, or health care provider.  2018 Elsevier/Gold Standard (2015-04-06 05:39:22) Iliotibial Bursitis Rehab Ask your health care provider which exercises are safe for you. Do exercises exactly as told by your health care provider and adjust them as directed. It is normal to feel mild stretching, pulling, tightness, or discomfort as you do these exercises, but you should stop right away if you feel sudden pain or your pain gets worse.Do not begin these exercises until told by your health care provider. Stretching and range of motion exercises These exercises warm up your muscles and joints and  improve the movement and flexibility of your leg. These exercises also help to relieve pain and stiffness. Exercise A: Quadriceps stretch, prone   1. Lie on your abdomen on a firm surface, such as a bed or padded floor. 2. Bend your left / right knee and hold your ankle. If you cannot reach your ankle or pant leg, loop a belt around your  foot and grab the belt instead. 3. Gently pull your heel toward your buttocks. Your knee should not slide out to the side. You should feel a stretch in the front of your thigh and knee. 4. Hold this position for __________ seconds. Repeat __________ times. Complete this exercise __________ times a day. Exercise B: Lunge (  adductor stretch) 1. Stand and spread your legs about 3 feet (about 1 m) apart. Put your left / right leg slightly back for balance. 2. Lean away from your left / right leg by bending your other knee and shifting your weight toward your bent knee. You may rest your hands on your thigh for balance. You should feel a stretch in your left / right inner thigh. 3. Hold for __________ seconds. Repeat __________ times. Complete this exercise __________ times a day. Exercise C: Hamstring stretch, supine  1. Lie on your back. 2. Hold both ends of a belt or towel as you loop it over the ball of your left / right foot. The ball of your foot is on the walking surface, right under your toes. 3. Straighten your left / right knee and slowly pull on the belt to raise your leg. Stop when you feel a gentle stretch in the back of your left / right knee or thigh.  Do not let your left / right knee bend.  Keep your other leg flat on the floor. 4. Hold this position for __________ seconds. Repeat __________ times. Complete this exercise __________ times a day. Strengthening exercises These exercises build strength and endurance in your leg. Endurance is the ability to use your muscles for a long time, even after they get tired. Exercise D: Quadriceps wall slides  1. Lean your back against a smooth wall or door while you walk your feet out 18-24 inches (46-61 cm) from it. 2. Place your feet hip-width apart. 3. Slowly slide down the wall or door until your knees bend as far as told by your health care provider. Keep your knees over your heels, not your toes. Keep your knees in line with your  hips. 4. Hold for __________ seconds. 5. Push through your heels to stand up to rest for __________ seconds after each repetition. Repeat __________ times. Complete this exercise __________ times a day. Exercise E: Straight leg raises ( hip abductors) 1. Lie on your side, with your left / right leg in the top position. Lie so your head, shoulder, knee, and hip line up with each other. You may bend your bottom knee to help you balance. 2. Lift your top leg 4-6 inches (10-15 cm) while keeping your toes pointed straight ahead. 3. Hold this position for __________ seconds. 4. Slowly lower your leg to the starting position. Allow your muscles to relax completely after each repetition. Repeat __________ times. Complete this exercise __________ times a day. Exercise F: Straight leg raises ( hip extensors) 1. Lie on your abdomen on a firm surface. You can put a pillow under your hips if that is more comfortable. 2. Tense the muscles in your buttocks and lift your left / right leg about  4-6 inches (10-15 cm). Keep your knee straight as you lift your leg. 3. Hold this position for __________ seconds. 4. Slowly lower your leg to the starting position. 5. Let your leg relax completely after each repetition. Repeat __________ times. Complete this exercise __________ times a day. Exercise G: Bridge ( hip extensors) 1. Lie on your back on a firm surface with your knees bent and your feet flat on the floor. 2. Tighten your buttocks muscles and lift your bottom off the floor until your trunk is level with your thighs.  Do not arch your back.  You should feel the muscles working in your buttocks and the back of your thighs. If you do not feel these muscles, slide your feet 1-2 inches (2.5-5 cm) farther away from your buttocks. 3. Hold this position for __________ seconds. 4. Slowly lower your hips to the starting position. 5. Let your buttocks muscles relax completely between repetitions. 6. If this  exercise is too easy, try doing it with your arms crossed over your chest. Repeat __________ times. Complete this exercise __________ times a day. This information is not intended to replace advice given to you by your health care provider. Make sure you discuss any questions you have with your health care provider. Document Released: 08/01/2005 Document Revised: 04/07/2016 Document Reviewed: 07/14/2015 Elsevier Interactive Patient Education  2017 [employer].

## 2017-01-12 ENCOUNTER — Telehealth: Payer: Self-pay | Admitting: Rheumatology

## 2017-01-12 NOTE — Telephone Encounter (Signed)
Kathlee Nations with [employer] left message requesting a return call on patient. I could not understand what the precert was for.

## 2017-01-16 NOTE — Telephone Encounter (Signed)
Authorized per [employer], valid 01/03/17 through 04/05/2017, auth # [id number]

## 2017-01-16 NOTE — Telephone Encounter (Signed)
Please call and schedule bilateral Euflexxa injections, buy and bill, the third injection needs to occur prior to 04/05/17 which is the expiration date of the auth.  Thank you.

## 2017-02-01 NOTE — Telephone Encounter (Signed)
Left a message for patient to call and schedule appts.

## 2017-02-07 NOTE — Telephone Encounter (Signed)
Left a message for patient to call the office back to schedule his injections.

## 2017-03-14 NOTE — Telephone Encounter (Signed)
LMOM for patient to call, and schedule knee injections if he is still interested in getting them.

## 2017-03-21 ENCOUNTER — Ambulatory Visit (INDEPENDENT_AMBULATORY_CARE_PROVIDER_SITE_OTHER): Payer: Managed Care, Other (non HMO) | Admitting: Rheumatology

## 2017-03-21 DIAGNOSIS — M1712 Unilateral primary osteoarthritis, left knee: Secondary | ICD-10-CM

## 2017-03-21 DIAGNOSIS — M170 Bilateral primary osteoarthritis of knee: Secondary | ICD-10-CM

## 2017-03-21 DIAGNOSIS — M1711 Unilateral primary osteoarthritis, right knee: Secondary | ICD-10-CM

## 2017-03-21 MED ORDER — LIDOCAINE HCL 1 % IJ SOLN
1.5000 mL | INTRAMUSCULAR | Status: AC | PRN
  Administered 2017-03-21: 1.5 mL

## 2017-03-21 MED ORDER — SODIUM HYALURONATE (VISCOSUP) 20 MG/2ML IX SOSY
20.0000 mg | PREFILLED_SYRINGE | INTRA_ARTICULAR | Status: AC | PRN
  Administered 2017-03-21: 20 mg via INTRA_ARTICULAR

## 2017-03-21 NOTE — Progress Notes (Signed)
   Procedure Note  Patient: Ronald Sosa             Date of Birth: 05-31-1956           MRN: [id number]             Visit Date: 03/21/2017  Procedures: Visit Diagnoses: Primary osteoarthritis of both knees  Large Joint Inj Date/Time: 03/21/2017 8:31 AM Performed by: Bo Merino Authorized by: Bo Merino   Consent Given by:  Patient Site marked: the procedure site was marked   Timeout: prior to procedure the correct patient, procedure, and site was verified   Indications:  Pain and joint swelling Location:  Knee Site:  R knee Prep: patient was prepped and draped in usual sterile fashion   Needle Size:  27 G Needle Length:  1.5 inches Approach:  Medial Ultrasound Guidance: No   Fluoroscopic Guidance: No   Arthrogram: No   Medications:  1.5 mL lidocaine 1 %; 20 mg Sodium Hyaluronate 20 MG/2ML Aspiration Attempted: Yes   Aspirate amount (mL):  0 Patient tolerance:  Patient tolerated the procedure well with no immediate complications Large Joint Inj Date/Time: 03/21/2017 8:32 AM Performed by: Bo Merino Authorized by: Bo Merino   Consent Given by:  Patient Site marked: the procedure site was marked   Timeout: prior to procedure the correct patient, procedure, and site was verified   Indications:  Pain and joint swelling Location:  Knee Site:  L knee Prep: patient was prepped and draped in usual sterile fashion   Needle Size:  27 G Needle Length:  1.5 inches Approach:  Medial Ultrasound Guidance: No   Fluoroscopic Guidance: No   Arthrogram: No   Medications:  1.5 mL lidocaine 1 %; 20 mg Sodium Hyaluronate 20 MG/2ML Aspiration Attempted: Yes   Aspirate amount (mL):  0 Patient tolerance:  Patient tolerated the procedure well with no immediate complications   Bo Merino, MD

## 2017-03-21 NOTE — Progress Notes (Signed)
   Procedure Note  Patient: Ronald Sosa             Date of Birth: 04/23/1956           MRN: [id number]             Visit Date: 03/28/2017  Procedures: Visit Diagnoses: Primary osteoarthritis of both knees Euflexxa #2 Bilateral knees   Large Joint Inj Date/Time: 03/28/2017 8:36 AM Performed by: Bo Merino Authorized by: Bo Merino   Consent Given by:  Patient Site marked: the procedure site was marked   Timeout: prior to procedure the correct patient, procedure, and site was verified   Indications:  Pain and joint swelling Location:  Knee Site:  R knee Prep: patient was prepped and draped in usual sterile fashion   Needle Size:  25 G Needle Length:  1.5 inches Approach:  Medial Ultrasound Guidance: No   Fluoroscopic Guidance: No   Arthrogram: No   Medications:  1.5 mL lidocaine 1 %; 20 mg Sodium Hyaluronate 20 MG/2ML Aspiration Attempted: Yes   Patient tolerance:  Patient tolerated the procedure well with no immediate complications Large Joint Inj Date/Time: 03/28/2017 8:37 AM Performed by: Bo Merino Authorized by: Bo Merino   Consent Given by:  Patient Site marked: the procedure site was marked   Timeout: prior to procedure the correct patient, procedure, and site was verified   Indications:  Pain and joint swelling Location:  Knee Site:  L knee Prep: patient was prepped and draped in usual sterile fashion   Needle Size:  25 G Needle Length:  1.5 inches Approach:  Medial Ultrasound Guidance: No   Fluoroscopic Guidance: No   Arthrogram: No   Medications:  1.5 mL lidocaine 1 %; 20 mg Sodium Hyaluronate 20 MG/2ML Aspiration Attempted: Yes   Patient tolerance:  Patient tolerated the procedure well with no immediate complications   Bo Merino, MD

## 2017-03-28 ENCOUNTER — Ambulatory Visit (INDEPENDENT_AMBULATORY_CARE_PROVIDER_SITE_OTHER): Payer: Managed Care, Other (non HMO) | Admitting: Rheumatology

## 2017-03-28 DIAGNOSIS — M1711 Unilateral primary osteoarthritis, right knee: Secondary | ICD-10-CM

## 2017-03-28 DIAGNOSIS — M1712 Unilateral primary osteoarthritis, left knee: Secondary | ICD-10-CM | POA: Diagnosis not present

## 2017-03-28 DIAGNOSIS — M170 Bilateral primary osteoarthritis of knee: Secondary | ICD-10-CM

## 2017-03-28 MED ORDER — LIDOCAINE HCL 1 % IJ SOLN
1.5000 mL | INTRAMUSCULAR | Status: AC | PRN
  Administered 2017-03-28: 1.5 mL

## 2017-03-28 MED ORDER — SODIUM HYALURONATE (VISCOSUP) 20 MG/2ML IX SOSY
20.0000 mg | PREFILLED_SYRINGE | INTRA_ARTICULAR | Status: AC | PRN
  Administered 2017-03-28: 20 mg via INTRA_ARTICULAR

## 2017-03-28 NOTE — Progress Notes (Signed)
   Procedure Note  Patient: Ronald Sosa             Date of Birth: 1956/02/28           MRN: [id number]             Visit Date: 04/04/2017  Procedures: Visit Diagnoses: Primary osteoarthritis of both knees Euflexxa #3 Bilateral knees  Large Joint Inj Date/Time: 04/04/2017 8:05 AM Performed by: Bo Merino Authorized by: Bo Merino   Consent Given by:  Patient Site marked: the procedure site was marked   Timeout: prior to procedure the correct patient, procedure, and site was verified   Indications:  Pain Location:  Knee Site:  R knee Prep: patient was prepped and draped in usual sterile fashion   Needle Size:  27 G Needle Length:  1.5 inches Ultrasound Guidance: No   Fluoroscopic Guidance: No   Arthrogram: No   Medications:  20 mg Sodium Hyaluronate 20 MG/2ML; 1.5 mL lidocaine 1 % Aspiration Attempted: Yes   Patient tolerance:  Patient tolerated the procedure well with no immediate complications Large Joint Inj Date/Time: 04/04/2017 8:07 AM Performed by: Bo Merino Authorized by: Bo Merino   Consent Given by:  Patient Site marked: the procedure site was marked   Timeout: prior to procedure the correct patient, procedure, and site was verified   Indications:  Pain Location:  Knee Site:  L knee Prep: patient was prepped and draped in usual sterile fashion   Needle Size:  27 G Needle Length:  1.5 inches Ultrasound Guidance: No   Fluoroscopic Guidance: No   Arthrogram: No   Medications:  20 mg Sodium Hyaluronate 20 MG/2ML; 1.5 mL lidocaine 1 % Aspiration Attempted: Yes   Patient tolerance:  Patient tolerated the procedure well with no immediate complications   Bo Merino, MD

## 2017-04-04 ENCOUNTER — Ambulatory Visit (INDEPENDENT_AMBULATORY_CARE_PROVIDER_SITE_OTHER): Payer: Managed Care, Other (non HMO) | Admitting: Rheumatology

## 2017-04-04 DIAGNOSIS — M170 Bilateral primary osteoarthritis of knee: Secondary | ICD-10-CM

## 2017-04-04 DIAGNOSIS — M1711 Unilateral primary osteoarthritis, right knee: Secondary | ICD-10-CM | POA: Diagnosis not present

## 2017-04-04 DIAGNOSIS — M1712 Unilateral primary osteoarthritis, left knee: Secondary | ICD-10-CM | POA: Diagnosis not present

## 2017-04-04 MED ORDER — SODIUM HYALURONATE (VISCOSUP) 20 MG/2ML IX SOSY
20.0000 mg | PREFILLED_SYRINGE | INTRA_ARTICULAR | Status: AC | PRN
  Administered 2017-04-04: 20 mg via INTRA_ARTICULAR

## 2017-04-04 MED ORDER — LIDOCAINE HCL 1 % IJ SOLN
1.5000 mL | INTRAMUSCULAR | Status: AC | PRN
  Administered 2017-04-04: 1.5 mL

## 2017-04-13 ENCOUNTER — Ambulatory Visit: Payer: Managed Care, Other (non HMO) | Admitting: Rheumatology

## 2017-04-27 ENCOUNTER — Ambulatory Visit: Payer: Managed Care, Other (non HMO) | Admitting: Rheumatology

## 2017-09-17 IMAGING — CT CT ABD-PELV W/ CM
2 of 5 series · 15 of 46 positions shown, 17 images · IV contrast (Omni 300)
Comparison: None.

CLINICAL DATA: RLQ pain since early this morning, nausea, denies
fever, no surgeries

EXAM:
CT ABDOMEN AND PELVIS WITH CONTRAST
TECHNIQUE: Multidetector CT imaging of the abdomen and pelvis was performed
using the standard protocol following bolus administration of
intravenous contrast.
CONTRAST:  WAV0IV-XMM IOPAMIDOL (WAV0IV-XMM) INJECTION 61%

[Series 3: a/p w/ 5mm · axial · 0.80mm/px · z∈[+705,+1170]mm · 12 of 105 slices shown, 14 images]
[im 6/105  soft-tissue]
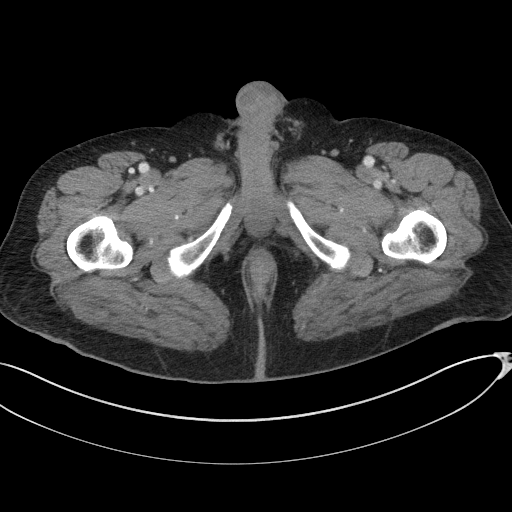
[im 6/105  bone]
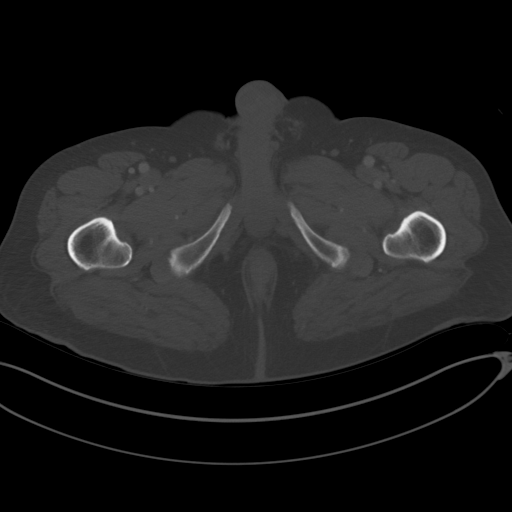
[im 18/105  soft-tissue]
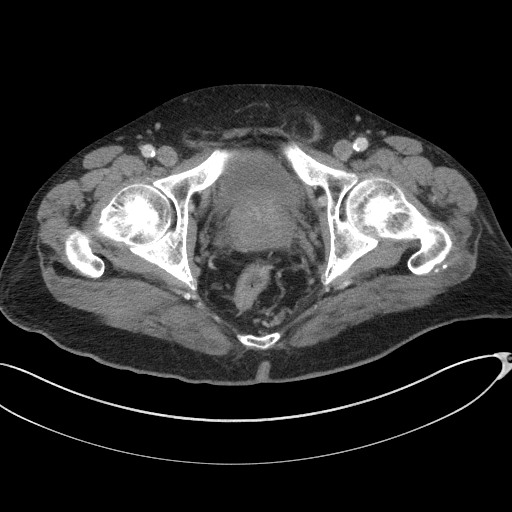
[im 24/105  soft-tissue]
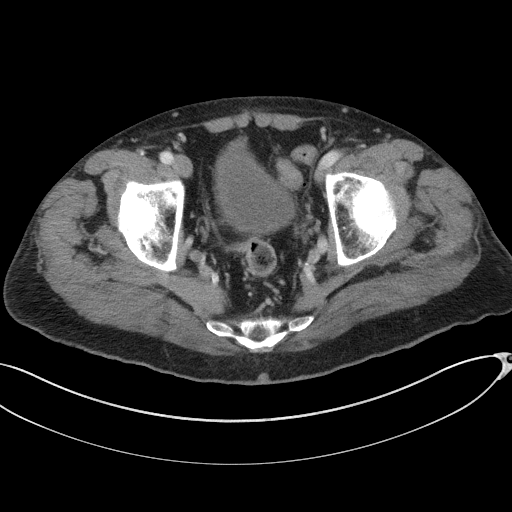
[im 29/105  soft-tissue]
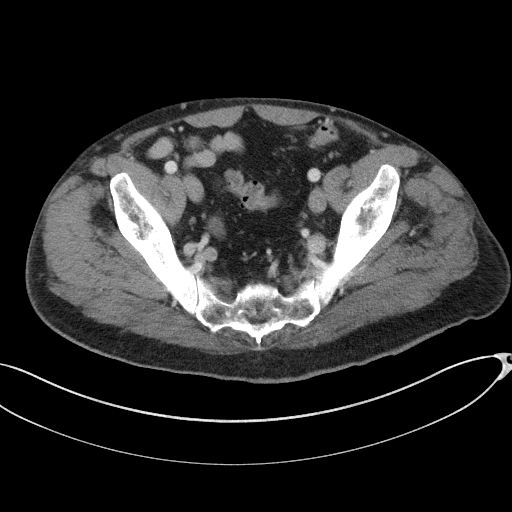
[im 41/105  soft-tissue]
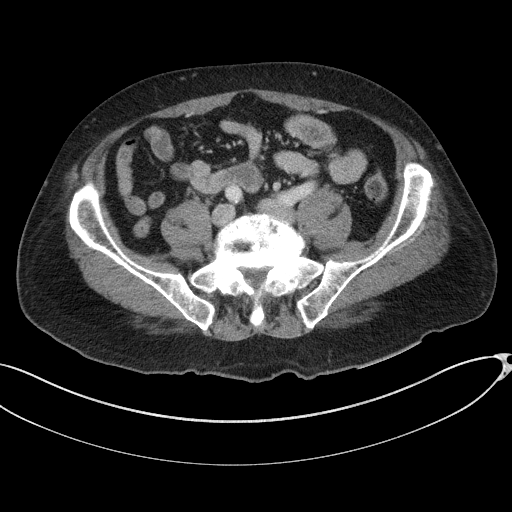
[im 47/105  soft-tissue]
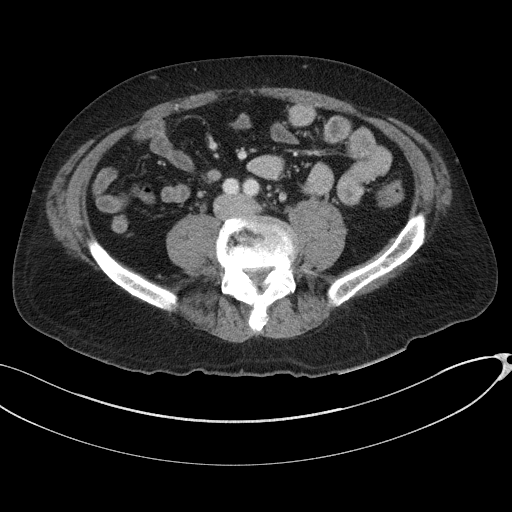
[im 58/105  soft-tissue]
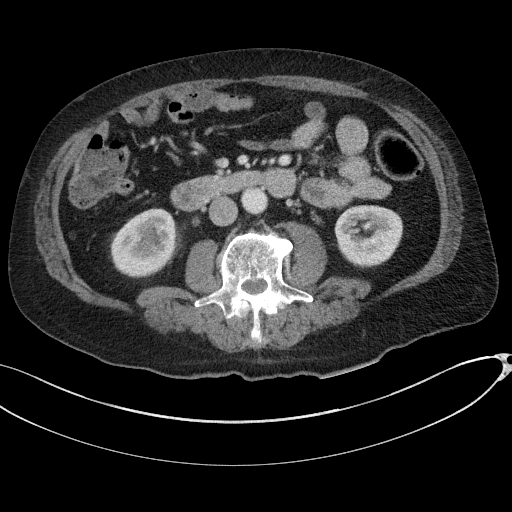
[im 64/105  soft-tissue]
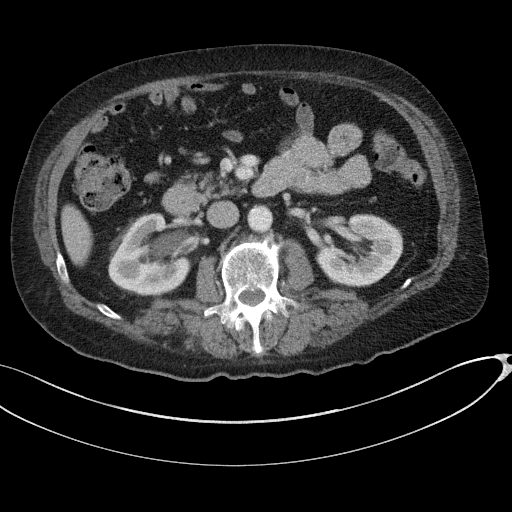
[im 76/105  soft-tissue]
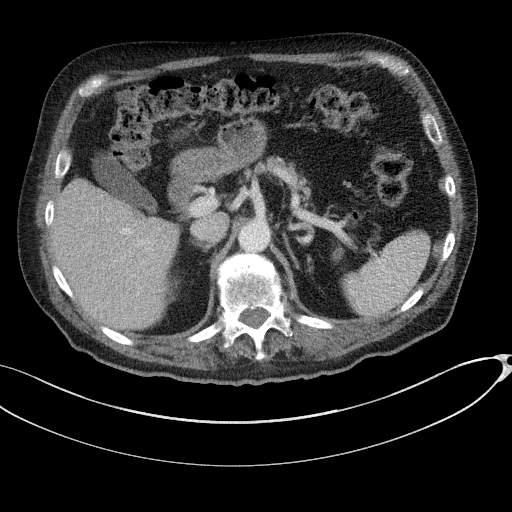
[im 76/105  bone]
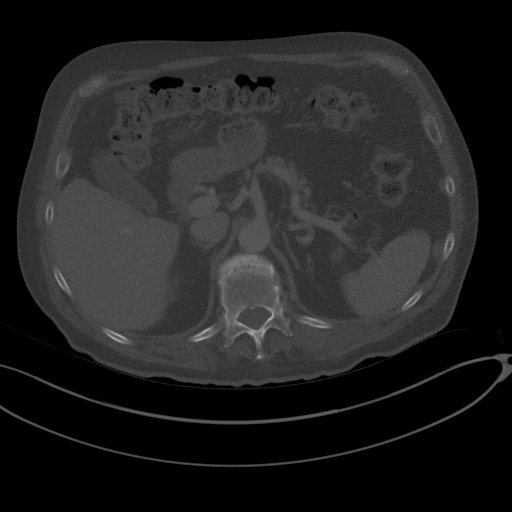
[im 81/105  soft-tissue]
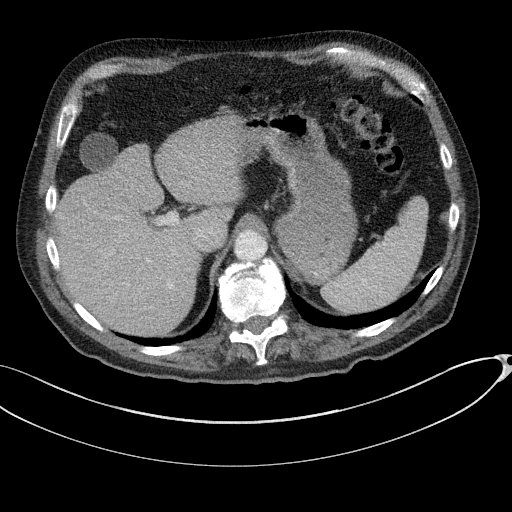
[im 87/105  soft-tissue]
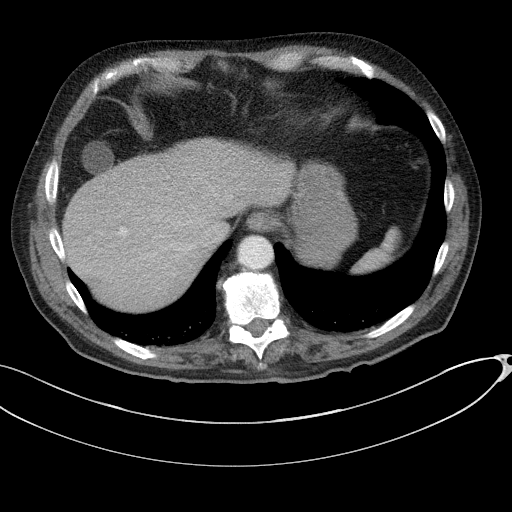
[im 99/105  soft-tissue]
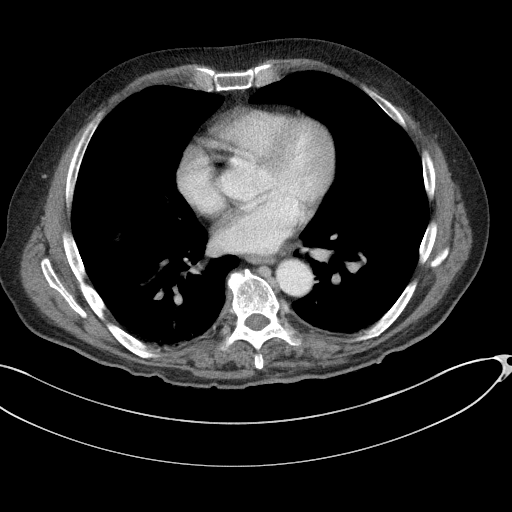

[Series 6: a/p w/ cor · coronal · 0.78mm/px · 3 of 149 slices shown]
[im 50/149  soft-tissue]
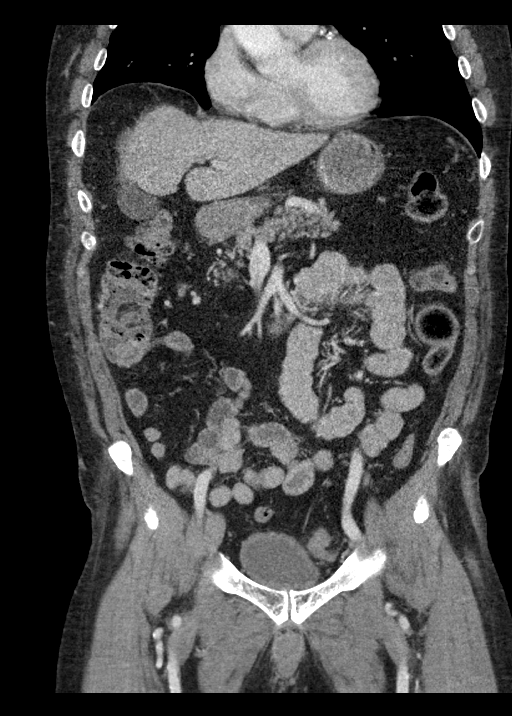
[im 66/149  soft-tissue]
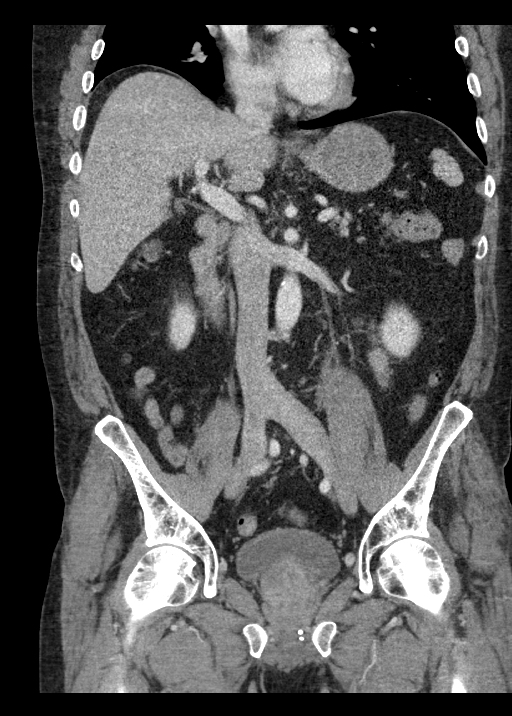
[im 83/149  soft-tissue]
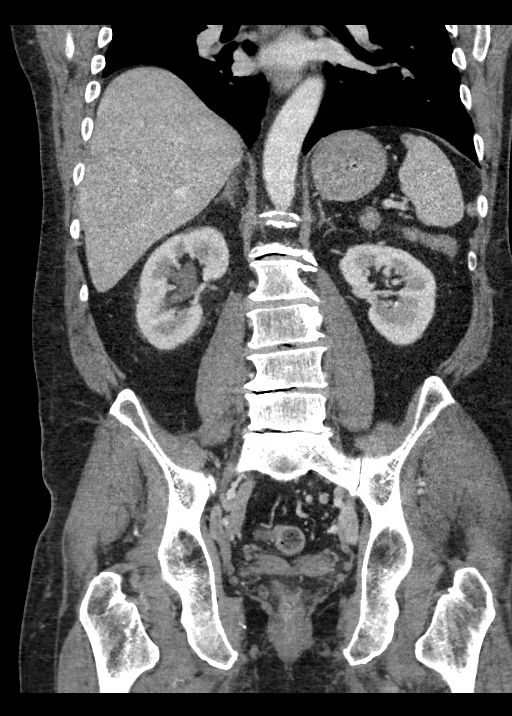

[15 of 46 positions shown; findings below may reference images not displayed]

FINDINGS: Lower chest: There is dependent change at both lung bases. No focal
consolidations or suspicious pulmonary nodules. Coronary artery
calcifications are identified. Heart size is normal. No pericardial
effusion.

Hepatobiliary: No focal liver abnormality is seen. No gallstones,
gallbladder wall thickening, or biliary dilatation.

Pancreas: Unremarkable. No pancreatic ductal dilatation or
surrounding inflammatory changes.

Spleen: Normal in appearance.

Adrenals/Urinary Tract: Adrenals are normal in appearance. A
nonobstructing intrarenal stone is identified in the lower pole the
left kidney measuring 2.5 mm. No renal mass. There is right-sided
hydronephrosis and right hydroureter secondary to a mid ureteral
calculus measuring 5 mm at the level of L5. Urinary bladder is
normal in appearance. There are prostatic calcifications. A
calcification measuring 4 mm projects in the region of the prostatic
urethra. This could represent a prostatic calcification or a
urethral stone. Prostate is mildly enlarged.

Stomach/Bowel: The stomach and small bowel loops are normal in
appearance. There are scattered colonic diverticula but no acute
diverticulitis. The appendix is well seen and has a normal
appearance.

Vascular/Lymphatic: There is atherosclerotic calcification of the
aorta and iliac arteries.

Reproductive: All prostatic enlargement and calcifications. Possible
prostatic urethral stone measuring 4 mm. See above.

Other: Small bilateral fat containing inguinal hernias are present.

Musculoskeletal: Degenerative changes are seen in the lower thoracic
and lumbar spine. No suspicious lytic or blastic lesions are
identified.
IMPRESSION: 1. Obstructing 5 mm calculus within the right ureter at the level of
L5.
2. A second possible calculus in the prostatic urethra measuring 4
mm versus prostatic calcification.
3. Prostatic enlargement.
4. Coronary artery disease.
5. Mild prostatic enlargement.
6. Small bilateral fat containing inguinal hernias.

## 2017-09-22 NOTE — Progress Notes (Deleted)
Office Visit Note  Patient: Ronald Sosa             Date of Birth: [birth year]           MRN: [id number]             PCP: Kathyrn Lass, MD Referring: Kathyrn Lass, MD Visit Date: 10/05/2017 Occupation: @GUAROCC @    Subjective:  No chief complaint on file.   History of Present Illness: Ronald Sosa is a 62 y.o. male ***   Activities of Daily Living:  Patient reports morning stiffness for *** {minute/hour:19697}.   Patient {ACTIONS;DENIES/REPORTS:21021675::"Denies"} nocturnal pain.  Difficulty dressing/grooming: {ACTIONS;DENIES/REPORTS:21021675::"Denies"} Difficulty climbing stairs: {ACTIONS;DENIES/REPORTS:21021675::"Denies"} Difficulty getting out of chair: {ACTIONS;DENIES/REPORTS:21021675::"Denies"} Difficulty using hands for taps, buttons, cutlery, and/or writing: {ACTIONS;DENIES/REPORTS:21021675::"Denies"}   No Rheumatology ROS completed.   PMFS History:  Patient Active Problem List   Diagnosis Date Noted  . Diverticula of colon 12/15/2016  . Rheumatoid arthritis of multiple sites with negative rheumatoid factor ([place]) 11/07/2016  . High risk medication use 11/07/2016  . Primary osteoarthritis of both knees 11/07/2016  . Arthralgia of left elbow 11/07/2016  . Leg wound, left, initial encounter 11/07/2016  . DDD (degenerative disc disease), lumbar 11/07/2016  . History of back strain 11/07/2016  . History of hypertension 11/07/2016    Past Medical History:  Diagnosis Date  . Hypertension     No family history on file. No past surgical history on file. Social History   Social History Narrative  . Not on file     Objective: Vital Signs: There were no vitals taken for this visit.   Physical Exam   Musculoskeletal Exam: ***  CDAI Exam: No CDAI exam completed.    Investigation: No additional findings. CBC Latest Ref Rng & Units 12/15/2016 03/23/2014 09/16/2007  WBC 4.0 - 10.5 K/uL 6.6 5.1 6.6  Hemoglobin 13.0 - 17.0 g/dL 16.0 15.2 11.0(L)    Hematocrit 39.0 - 52.0 % 47.9 44.6 32.6(L)  Platelets 150 - 400 K/uL 302 247 318   CMP Latest Ref Rng & Units 12/15/2016 11/19/2015 03/23/2014  Glucose 65 - 99 mg/dL 168(H) - 110(H)  BUN 6 - 20 mg/dL 14 - 15  Creatinine 0.61 - 1.24 mg/dL 0.92 0.60(L) 0.76  Sodium 135 - 145 mmol/L 138 - 142  Potassium 3.5 - 5.1 mmol/L 3.5 - 4.1  Chloride 101 - 111 mmol/L 106 - 104  CO2 22 - 32 mmol/L 23 - 28  Calcium 8.9 - 10.3 mg/dL 9.3 - 9.6  Total Protein 6.5 - 8.1 g/dL 7.0 - 7.3  Total Bilirubin 0.3 - 1.2 mg/dL 1.4(H) - 0.6  Alkaline Phos 38 - 126 U/L 70 - 70  AST 15 - 41 U/L 27 - 18  ALT 17 - 63 U/L 19 - 9    Imaging: No results found.  Speciality Comments: No specialty comments available.    Procedures:  No procedures performed Allergies: Patient has no known allergies.   Assessment / Plan:     Visit Diagnoses: No diagnosis found.    Orders: No orders of the defined types were placed in this encounter.  No orders of the defined types were placed in this encounter.   Face-to-face time spent with patient was *** minutes. 50% of time was spent in counseling and coordination of care.  Follow-Up Instructions: No Follow-up on file.   Earnestine Mealing, CMA  Note - This record has been created using Editor, commissioning.  Chart creation errors have been sought, but may not  always  have been located. Such creation errors do not reflect on  the standard of medical care.

## 2017-10-04 NOTE — Progress Notes (Addendum)
Office Visit Note  Patient: Ronald Sosa             Date of Birth: 1955/12/18           MRN: [id number]             PCP: Kathyrn Lass, MD Referring: Kathyrn Lass, MD Visit Date: 10/11/2017 Occupation: @GUAROCC @    Subjective:  Bilateral knee pain    History of Present Illness: KARNELL VANDERLOOP is a 62 y.o. male with history of seronegative rheumatoid arthritis and osteoarthritis of bilateral knees.  Patient states he has been off of Humira and MTX for almost 4 years due to getting an infection from a dog bite.  He has not had a rheumatoid arthritis flare since discontinuing.  He denies any hand pain, swelling, or stiffness at this time.  He denies any pain in his feet, shoulders, or elbows.  He states his main concern is pain in his bilateral knees.  He states his left knee is worse than his right knee.  He states his left knee swelling if he is standing on his knee more than 20 minutes.  He states he has significant discomfort when climbing stairs.  He has stiffness and limitation of motion of bilateral knees.  He states he had Euflexxa injections in August 2018.  He states that after his last injection he developed swelling and stiffness in his left knee for a few weeks.  He takes tylenol and/or ibuprofen for pain relief.  He states that he got a brace to wear on his left knee.  He denies icing or elevating his knee.  He states that his left trochanteric bursa occasionally gets tender.  He states his lower back has not been causing discomfort but he occasionally has morning stiffness and stiffness in his neck.  He has been avoiding lifting heavy objects, which has helped his lower back.     Activities of Daily Living:  Patient reports morning stiffness for 25  minutes.   Patient Denies nocturnal pain.  Difficulty dressing/grooming: Denies Difficulty climbing stairs: Reports Difficulty getting out of chair: Reports Difficulty using hands for taps, buttons, cutlery, and/or writing:  Denies   Review of Systems  Constitutional: Negative for fatigue, night sweats and weakness.  HENT: Negative for mouth sores, mouth dryness and nose dryness.   Eyes: Negative for pain, redness, visual disturbance and dryness.  Respiratory: Negative for cough, hemoptysis, shortness of breath and difficulty breathing.   Cardiovascular: Positive for hypertension. Negative for chest pain, palpitations, irregular heartbeat and swelling in legs/feet.  Gastrointestinal: Negative for blood in stool, constipation and diarrhea.  Endocrine: Negative for increased urination.  Genitourinary: Negative for painful urination.  Musculoskeletal: Positive for arthralgias, joint pain, joint swelling and morning stiffness. Negative for myalgias, muscle weakness, muscle tenderness and myalgias.  Skin: Positive for color change (Raynaud's) and sensitivity to sunlight. Negative for pallor, rash, hair loss, nodules/bumps, redness, skin tightness and ulcers.  Allergic/Immunologic: Negative for susceptible to infections.  Neurological: Negative for dizziness, fainting, memory loss and night sweats.  Hematological: Negative for swollen glands.  Psychiatric/Behavioral: Negative for depressed mood and sleep disturbance. The patient is not nervous/anxious.     PMFS History:  Patient Active Problem List   Diagnosis Date Noted  . Diverticula of colon 12/15/2016  . Rheumatoid arthritis of multiple sites with negative rheumatoid factor ([place]) 11/07/2016  . High risk medication use 11/07/2016  . Primary osteoarthritis of both knees 11/07/2016  . Arthralgia of left elbow 11/07/2016  .  Leg wound, left, initial encounter 11/07/2016  . DDD (degenerative disc disease), lumbar 11/07/2016  . History of back strain 11/07/2016  . History of hypertension 11/07/2016    Past Medical History:  Diagnosis Date  . Hypertension     Family History  Problem Relation Age of Onset  . Arthritis Mother   . Asthma Mother   . Emphysema  Father   . Diabetes Sister   . Cancer Brother        prostate   . Hypertension Brother   . Diabetes Brother    Past Surgical History:  Procedure Laterality Date  . dog bite     surgery on left leg   . HAND SURGERY    . LEG SURGERY Right    cyst removal   . MANDIBLE FRACTURE SURGERY     Social History   Social History Narrative  . Not on file     Objective: Vital Signs: BP (!) 145/95 (BP Location: Left Arm, Patient Position: Sitting, Cuff Size: Normal)   Pulse 63   Resp 14   Ht 5\' 10"  (1.778 m)   Wt 207 lb (93.9 kg)   BMI 29.70 kg/m    Physical Exam  Constitutional: He is oriented to person, place, and time. He appears well-developed and well-nourished.  HENT:  Head: Normocephalic and atraumatic.  Eyes: Conjunctivae and EOM are normal. Pupils are equal, round, and reactive to light.  Neck: Normal range of motion. Neck supple.  Cardiovascular: Normal rate, regular rhythm and normal heart sounds.  Pulmonary/Chest: Effort normal and breath sounds normal.  Abdominal: Soft. Bowel sounds are normal.  Neurological: He is alert and oriented to person, place, and time.  Skin: Skin is warm and dry. Capillary refill takes less than 2 seconds.  Psychiatric: He has a normal mood and affect. His behavior is normal.  Nursing note and vitals reviewed.    Musculoskeletal Exam: C-spine limited ROM with lateral rotation.  Thoracic and lumbar spine good ROM.  No midline spinal tenderness.  No SI joint tenderness.  Shoulder joints good ROM.  Elbow joint contractures bilaterally.  Wrist joints, MCPs, PIPs, and DIPs good ROM with no synovitis.  Synovial thickening of PIP and DIPs.  Hip joints good ROM.  No tenderness of trochanteric bursa.  Limited flexion and extension of bilateral knees.  Left knee warmth but no effusion.  Crepitus of bilateral knees. Ankle joints, MTPs, PIPs, and DIPs good ROM with no synovitis.     CDAI Exam: CDAI Homunculus Exam:   Joint Counts:  CDAI Tender Joint  count: 0 CDAI Swollen Joint count: 0  Global Assessments:  Patient Global Assessment: 4 Provider Global Assessment: 4  CDAI Calculated Score: 8    Investigation: No additional findings. CBC Latest Ref Rng & Units 12/15/2016 03/23/2014 09/16/2007  WBC 4.0 - 10.5 K/uL 6.6 5.1 6.6  Hemoglobin 13.0 - 17.0 g/dL 16.0 15.2 11.0(L)  Hematocrit 39.0 - 52.0 % 47.9 44.6 32.6(L)  Platelets 150 - 400 K/uL 302 247 318   CMP Latest Ref Rng & Units 12/15/2016 11/19/2015 03/23/2014  Glucose 65 - 99 mg/dL 168(H) - 110(H)  BUN 6 - 20 mg/dL 14 - 15  Creatinine 0.61 - 1.24 mg/dL 0.92 0.60(L) 0.76  Sodium 135 - 145 mmol/L 138 - 142  Potassium 3.5 - 5.1 mmol/L 3.5 - 4.1  Chloride 101 - 111 mmol/L 106 - 104  CO2 22 - 32 mmol/L 23 - 28  Calcium 8.9 - 10.3 mg/dL 9.3 - 9.6  Total  Protein 6.5 - 8.1 g/dL 7.0 - 7.3  Total Bilirubin 0.3 - 1.2 mg/dL 1.4(H) - 0.6  Alkaline Phos 38 - 126 U/L 70 - 70  AST 15 - 41 U/L 27 - 18  ALT 17 - 63 U/L 19 - 9    Imaging: Xr Hand 2 View Left  Result Date: 10/11/2017 PIP and DIP narrowing was noted.  Second and third MCP joint narrowing was noted with third MCP subluxation.  Mild intercarpal  joint and radiocarpal joint space narrowing was noted.  Some cystic changes noted in carpal bones. Impression: These findings are consistent with rheumatoid arthritis and osteoarthritis overlap.  Xr Hand 2 View Right  Result Date: 10/11/2017 All PIP and DIP joint space narrowing was noted.  Right second and third MCP joint narrowing was noted.  Mild intercarpal and radiocarpal joint space narrowing with cystic changes in the carpal and ulnar area was noted. Impression: These findings are consistent with rheumatoid arthritis and osteoarthritis overlap.  Xr Knee 3 View Left  Result Date: 10/11/2017 Moderate to severe lateral compartment narrowing was noted.  No chondrocalcinosis was noted.  Severe patellofemoral narrowing was noted. Impression: These findings are consistent with moderate to  severe osteoarthritis and severe chondromalacia patella.  Xr Knee 3 View Right  Result Date: 10/11/2017 Moderate to severe lateral compartment narrowing with lateral osteophytes were noted.  No chondrocalcinosis was noted.  Severe patellofemoral narrowing was noted. Impression: These findings are consistent with moderate to severe osteoarthritis and severe chondromalacia patella.   Speciality Comments: No specialty comments available.    Procedures:  Large Joint Inj: L knee on 10/11/2017 8:55 AM Indications: pain Details: 27 G 1.5 in needle, medial approach  Arthrogram: No  Medications: 1.5 mL lidocaine 1 %; 40 mg triamcinolone acetonide 40 MG/ML Aspirate: 0 mL Outcome: tolerated well, no immediate complications Procedure, treatment alternatives, risks and benefits explained, specific risks discussed. Consent was given by the patient. Immediately prior to procedure a time out was called to verify the correct patient, procedure, equipment, support staff and site/side marked as required. Patient was prepped and draped in the usual sterile fashion.     Allergies: Patient has no known allergies.   Assessment / Plan:     Visit Diagnoses: Rheumatoid arthritis of multiple sites with negative rheumatoid factor ([place]) - He has no synovitis on exam.  He has been off of his Humira and MTX for 4 years May 1st.  He has not had any recent flares.  He denies any tenderness or swelling in his hands.  X-rays of his hands were obtained today.  X-rays reveal findings consistent with rheumatoid arthritis and osteoarthritis overlap.  He has cystic changes in bilateral carpal bones and cystic change in the right ulnar area. Plan: XR Hand 2 View Right, XR Hand 2 View Left  High risk medication use - Previously on Humira and MTX.    Primary osteoarthritis of both knees - He is having bilateral knee pain.  He has had more difficulty climbing stairs and swelling in his left knee if stands for a prolonged period  of time.  He has warmth of his left knee but no effusion.  He has bilateral knee crepitus.  He previously had Euflexxa injections in August 2018.  He was given a handout of knee exercises he can perform at home. He had a left knee cortisone injection today in the office.  He was advised to monitor his blood pressure regularly.  He tolerated the procedure well. X-rays of  his bilateral knees were obtained today.  X-rays revealed severe lateral compartment narrowing.  No chondrocalcinosis was noted. Plan: XR KNEE 3 VIEW RIGHT, XR KNEE 3 VIEW LEFT  Chronic pain of both knees -He is having increased pain in his bilateral knees, especially his left knee. X-rays of bilateral knees were performed today. Plan: XR KNEE 3 VIEW RIGHT, XR KNEE 3 VIEW LEFT   Trochanteric bursitis, left hip: He has no tenderness on exam today.    DDD (degenerative disc disease), lumbar: He has no discomfort or midline spinal tenderness on exam.  He experiences occasional morning stiffness in his lumbar spine.    Other medical conditions are listed as follows:   History of hypertension  Diverticula of colon    Orders: Orders Placed This Encounter  Procedures  . Large Joint Inj: L knee  . XR KNEE 3 VIEW RIGHT  . XR KNEE 3 VIEW LEFT  . XR Hand 2 View Right  . XR Hand 2 View Left   No orders of the defined types were placed in this encounter.   Face-to-face time spent with patient was 30 minutes. Greater than 50% of time was spent in counseling and coordination of care.  Follow-Up Instructions: Return in about 5 months (around 03/10/2018) for Rheumatoid arthritis, Osteoarthritis.   Ofilia Neas, PA-C   I examined and evaluated the patient with Hazel Sams PA.  Patient had no synovitis on my exam today.  We will continue to monitor his labs and continue current treatment.  The plan of care was discussed as noted above.  Bo Merino, MD  Note - This record has been created using Editor, commissioning.  Chart  creation errors have been sought, but may not always  have been located. Such creation errors do not reflect on  the standard of medical care.

## 2017-10-05 ENCOUNTER — Ambulatory Visit: Payer: Managed Care, Other (non HMO) | Admitting: Physician Assistant

## 2017-10-11 ENCOUNTER — Encounter: Payer: Self-pay | Admitting: Physician Assistant

## 2017-10-11 ENCOUNTER — Ambulatory Visit: Payer: 59 | Admitting: Rheumatology

## 2017-10-11 ENCOUNTER — Ambulatory Visit (INDEPENDENT_AMBULATORY_CARE_PROVIDER_SITE_OTHER): Payer: Self-pay

## 2017-10-11 ENCOUNTER — Ambulatory Visit (INDEPENDENT_AMBULATORY_CARE_PROVIDER_SITE_OTHER): Payer: 59

## 2017-10-11 VITALS — BP 145/95 | HR 63 | Resp 14 | Ht 70.0 in | Wt 207.0 lb

## 2017-10-11 DIAGNOSIS — M5136 Other intervertebral disc degeneration, lumbar region: Secondary | ICD-10-CM

## 2017-10-11 DIAGNOSIS — M0609 Rheumatoid arthritis without rheumatoid factor, multiple sites: Secondary | ICD-10-CM

## 2017-10-11 DIAGNOSIS — K5730 Diverticulosis of large intestine without perforation or abscess without bleeding: Secondary | ICD-10-CM | POA: Diagnosis not present

## 2017-10-11 DIAGNOSIS — M170 Bilateral primary osteoarthritis of knee: Secondary | ICD-10-CM | POA: Diagnosis not present

## 2017-10-11 DIAGNOSIS — Z79899 Other long term (current) drug therapy: Secondary | ICD-10-CM

## 2017-10-11 DIAGNOSIS — M25561 Pain in right knee: Secondary | ICD-10-CM

## 2017-10-11 DIAGNOSIS — M7062 Trochanteric bursitis, left hip: Secondary | ICD-10-CM | POA: Diagnosis not present

## 2017-10-11 DIAGNOSIS — Z8679 Personal history of other diseases of the circulatory system: Secondary | ICD-10-CM

## 2017-10-11 DIAGNOSIS — G8929 Other chronic pain: Secondary | ICD-10-CM

## 2017-10-11 DIAGNOSIS — M25562 Pain in left knee: Secondary | ICD-10-CM

## 2017-10-11 MED ORDER — LIDOCAINE HCL 1 % IJ SOLN
1.5000 mL | INTRAMUSCULAR | Status: AC | PRN
  Administered 2017-10-11: 1.5 mL

## 2017-10-11 MED ORDER — TRIAMCINOLONE ACETONIDE 40 MG/ML IJ SUSP
40.0000 mg | INTRAMUSCULAR | Status: AC | PRN
  Administered 2017-10-11: 40 mg via INTRA_ARTICULAR

## 2017-10-11 NOTE — Patient Instructions (Signed)

## 2019-05-27 DIAGNOSIS — R1110 Vomiting, unspecified: Secondary | ICD-10-CM | POA: Diagnosis not present

## 2019-06-05 DIAGNOSIS — R69 Illness, unspecified: Secondary | ICD-10-CM | POA: Diagnosis not present

## 2020-02-12 ENCOUNTER — Ambulatory Visit: Payer: Self-pay | Admitting: General Surgery

## 2020-02-12 DIAGNOSIS — K4090 Unilateral inguinal hernia, without obstruction or gangrene, not specified as recurrent: Secondary | ICD-10-CM | POA: Diagnosis not present

## 2020-04-15 DIAGNOSIS — R69 Illness, unspecified: Secondary | ICD-10-CM | POA: Diagnosis not present

## 2020-04-23 DIAGNOSIS — K4090 Unilateral inguinal hernia, without obstruction or gangrene, not specified as recurrent: Secondary | ICD-10-CM | POA: Diagnosis not present

## 2020-04-23 DIAGNOSIS — G8918 Other acute postprocedural pain: Secondary | ICD-10-CM | POA: Diagnosis not present

## 2020-05-14 DIAGNOSIS — R69 Illness, unspecified: Secondary | ICD-10-CM | POA: Diagnosis not present

## 2020-08-13 DIAGNOSIS — R69 Illness, unspecified: Secondary | ICD-10-CM | POA: Diagnosis not present

## 2020-09-18 DIAGNOSIS — X32XXXD Exposure to sunlight, subsequent encounter: Secondary | ICD-10-CM | POA: Diagnosis not present

## 2020-09-18 DIAGNOSIS — L570 Actinic keratosis: Secondary | ICD-10-CM | POA: Diagnosis not present

## 2020-09-24 DIAGNOSIS — E7800 Pure hypercholesterolemia, unspecified: Secondary | ICD-10-CM | POA: Diagnosis not present

## 2020-09-24 DIAGNOSIS — I10 Essential (primary) hypertension: Secondary | ICD-10-CM | POA: Diagnosis not present

## 2020-09-24 DIAGNOSIS — Z125 Encounter for screening for malignant neoplasm of prostate: Secondary | ICD-10-CM | POA: Diagnosis not present

## 2020-09-29 DIAGNOSIS — Z1211 Encounter for screening for malignant neoplasm of colon: Secondary | ICD-10-CM | POA: Diagnosis not present

## 2020-09-29 DIAGNOSIS — R972 Elevated prostate specific antigen [PSA]: Secondary | ICD-10-CM | POA: Diagnosis not present

## 2020-09-29 DIAGNOSIS — N400 Enlarged prostate without lower urinary tract symptoms: Secondary | ICD-10-CM | POA: Diagnosis not present

## 2020-09-29 DIAGNOSIS — I10 Essential (primary) hypertension: Secondary | ICD-10-CM | POA: Diagnosis not present

## 2020-09-29 DIAGNOSIS — E7800 Pure hypercholesterolemia, unspecified: Secondary | ICD-10-CM | POA: Diagnosis not present

## 2021-04-07 DIAGNOSIS — M1990 Unspecified osteoarthritis, unspecified site: Secondary | ICD-10-CM | POA: Diagnosis not present

## 2021-04-07 DIAGNOSIS — M170 Bilateral primary osteoarthritis of knee: Secondary | ICD-10-CM | POA: Diagnosis not present

## 2021-04-07 DIAGNOSIS — I10 Essential (primary) hypertension: Secondary | ICD-10-CM | POA: Diagnosis not present

## 2021-04-07 DIAGNOSIS — Z23 Encounter for immunization: Secondary | ICD-10-CM | POA: Diagnosis not present

## 2021-04-07 DIAGNOSIS — N400 Enlarged prostate without lower urinary tract symptoms: Secondary | ICD-10-CM | POA: Diagnosis not present

## 2021-04-07 DIAGNOSIS — Z0000 Encounter for general adult medical examination without abnormal findings: Secondary | ICD-10-CM | POA: Diagnosis not present

## 2021-04-07 DIAGNOSIS — E7800 Pure hypercholesterolemia, unspecified: Secondary | ICD-10-CM | POA: Diagnosis not present

## 2021-07-13 DIAGNOSIS — F439 Reaction to severe stress, unspecified: Secondary | ICD-10-CM | POA: Diagnosis not present

## 2021-07-13 DIAGNOSIS — I10 Essential (primary) hypertension: Secondary | ICD-10-CM | POA: Diagnosis not present

## 2021-07-13 DIAGNOSIS — R69 Illness, unspecified: Secondary | ICD-10-CM | POA: Diagnosis not present

## 2021-08-24 DIAGNOSIS — F439 Reaction to severe stress, unspecified: Secondary | ICD-10-CM | POA: Diagnosis not present

## 2021-08-24 DIAGNOSIS — R69 Illness, unspecified: Secondary | ICD-10-CM | POA: Diagnosis not present

## 2021-08-24 DIAGNOSIS — R972 Elevated prostate specific antigen [PSA]: Secondary | ICD-10-CM | POA: Diagnosis not present

## 2021-08-24 DIAGNOSIS — E669 Obesity, unspecified: Secondary | ICD-10-CM | POA: Diagnosis not present

## 2021-08-24 DIAGNOSIS — I10 Essential (primary) hypertension: Secondary | ICD-10-CM | POA: Diagnosis not present

## 2021-10-27 DIAGNOSIS — I10 Essential (primary) hypertension: Secondary | ICD-10-CM | POA: Diagnosis not present

## 2021-10-27 DIAGNOSIS — H52229 Regular astigmatism, unspecified eye: Secondary | ICD-10-CM | POA: Diagnosis not present

## 2022-04-06 DIAGNOSIS — X32XXXD Exposure to sunlight, subsequent encounter: Secondary | ICD-10-CM | POA: Diagnosis not present

## 2022-04-06 DIAGNOSIS — L570 Actinic keratosis: Secondary | ICD-10-CM | POA: Diagnosis not present

## 2022-04-06 DIAGNOSIS — L820 Inflamed seborrheic keratosis: Secondary | ICD-10-CM | POA: Diagnosis not present

## 2022-04-06 DIAGNOSIS — C44212 Basal cell carcinoma of skin of right ear and external auricular canal: Secondary | ICD-10-CM | POA: Diagnosis not present

## 2022-05-18 DIAGNOSIS — Z85828 Personal history of other malignant neoplasm of skin: Secondary | ICD-10-CM | POA: Diagnosis not present

## 2022-05-18 DIAGNOSIS — Z08 Encounter for follow-up examination after completed treatment for malignant neoplasm: Secondary | ICD-10-CM | POA: Diagnosis not present

## 2022-08-10 DIAGNOSIS — H5203 Hypermetropia, bilateral: Secondary | ICD-10-CM | POA: Diagnosis not present

## 2022-08-10 DIAGNOSIS — H52223 Regular astigmatism, bilateral: Secondary | ICD-10-CM | POA: Diagnosis not present

## 2022-10-03 DIAGNOSIS — R972 Elevated prostate specific antigen [PSA]: Secondary | ICD-10-CM | POA: Diagnosis not present

## 2022-10-03 DIAGNOSIS — E7800 Pure hypercholesterolemia, unspecified: Secondary | ICD-10-CM | POA: Diagnosis not present

## 2022-10-03 DIAGNOSIS — I10 Essential (primary) hypertension: Secondary | ICD-10-CM | POA: Diagnosis not present

## 2022-10-03 DIAGNOSIS — K449 Diaphragmatic hernia without obstruction or gangrene: Secondary | ICD-10-CM | POA: Diagnosis not present

## 2022-10-03 DIAGNOSIS — R69 Illness, unspecified: Secondary | ICD-10-CM | POA: Diagnosis not present

## 2022-10-03 DIAGNOSIS — Z6831 Body mass index (BMI) 31.0-31.9, adult: Secondary | ICD-10-CM | POA: Diagnosis not present

## 2022-10-10 ENCOUNTER — Encounter (HOSPITAL_COMMUNITY): Payer: Self-pay | Admitting: *Deleted

## 2022-10-10 ENCOUNTER — Other Ambulatory Visit: Payer: Self-pay

## 2022-10-10 ENCOUNTER — Emergency Department (HOSPITAL_COMMUNITY)
Admission: EM | Admit: 2022-10-10 | Discharge: 2022-10-10 | Disposition: A | Discharge status: Home / Self Care | Payer: Medicare HMO | Attending: Emergency Medicine | Admitting: Emergency Medicine

## 2022-10-10 DIAGNOSIS — R69 Illness, unspecified: Secondary | ICD-10-CM | POA: Diagnosis not present

## 2022-10-10 DIAGNOSIS — F32A Depression, unspecified: Secondary | ICD-10-CM | POA: Diagnosis not present

## 2022-10-10 DIAGNOSIS — Z79899 Other long term (current) drug therapy: Secondary | ICD-10-CM | POA: Diagnosis not present

## 2022-10-10 DIAGNOSIS — I10 Essential (primary) hypertension: Secondary | ICD-10-CM | POA: Diagnosis not present

## 2022-10-10 DIAGNOSIS — I4510 Unspecified right bundle-branch block: Secondary | ICD-10-CM | POA: Diagnosis not present

## 2022-10-10 LAB — CBC
HCT: 46.3 % (ref 39.0–52.0)
Hemoglobin: 15.8 g/dL (ref 13.0–17.0)
MCH: 31.2 pg (ref 26.0–34.0)
MCHC: 34.1 g/dL (ref 30.0–36.0)
MCV: 91.5 fL (ref 80.0–100.0)
Platelets: 281 10*3/uL (ref 150–400)
RBC: 5.06 MIL/uL (ref 4.22–5.81)
RDW: 13.2 % (ref 11.5–15.5)
WBC: 5.4 10*3/uL (ref 4.0–10.5)
nRBC: 0 % (ref 0.0–0.2)

## 2022-10-10 LAB — BASIC METABOLIC PANEL
Anion gap: 7 (ref 5–15)
BUN: 11 mg/dL (ref 8–23)
CO2: 26 mmol/L (ref 22–32)
Calcium: 9.1 mg/dL (ref 8.9–10.3)
Chloride: 104 mmol/L (ref 98–111)
Creatinine, Ser: 0.85 mg/dL (ref 0.61–1.24)
GFR, Estimated: >60 mL/min (ref >60)
Glucose, Bld: 122 mg/dL — ABNORMAL HIGH (ref 70–99)
Potassium: 3.9 mmol/L (ref 3.5–5.1)
Sodium: 137 mmol/L (ref 135–145)

## 2022-10-10 MED ORDER — LISINOPRIL 10 MG PO TABS
10.0000 mg | ORAL_TABLET | Freq: Once | ORAL | Status: AC
  Administered 2022-10-10: 10 mg via ORAL
  Filled 2022-10-10: qty 1

## 2022-10-10 NOTE — Discharge Instructions (Signed)
Please read and follow all provided instructions.  Your diagnoses today include:  1. Primary hypertension    Your blood pressure was high today (BP): BP (!) 163/108 (BP Location: Right Arm)   Pulse 68   Temp 98.1 F (36.7 C) (Oral)   Resp 14   Ht '5\' 10"'$  (1.778 m)   Wt 92.5 kg   SpO2 99%   BMI 29.27 kg/m   Tests performed today include: Vital signs. See below for your results today.   Medications prescribed:  None  Home care instructions:  Follow any educational materials contained in this packet.  If number of your blood pressure is greater than 150 or the bottom number is greater than 90, 2 hours after taking your first dose of lisinopril, you may take a second dose of '10mg'$  until you can follow-up with your doctor.  Follow-up instructions: Please follow-up with your primary care provider in the next 7 days for a recheck of your symptoms and blood pressure.    Return instructions:  Please return to the Emergency Department if you experience worsening symptoms.  Return with severe chest pain, abdominal pain, or shortness of breath.  Return with severe headache, focal weakness, numbness, difficulty with speech or vision. Return with loss of vision or sudden vision change. Please return if you have any other emergent concerns.  Additional Information:  Your vital signs today were: BP (!) 163/108 (BP Location: Right Arm)   Pulse 68   Temp 98.1 F (36.7 C) (Oral)   Resp 14   Ht '5\' 10"'$  (1.778 m)   Wt 92.5 kg   SpO2 99%   BMI 29.27 kg/m  If your blood pressure (BP) was elevated above 135/85 this visit, please have this repeated by your doctor within one month. --------------

## 2022-10-10 NOTE — ED Provider Notes (Signed)
[facility] Provider Note   CSN: [id number] Arrival date & time: 10/10/22  1014     History  Chief Complaint  Patient presents with   Hypertension    Ronald Sosa is a 67 y.o. male.  Patient with history of hypertension, currently on 10 mg of lisinopril daily presents to the emergency department over concern for high blood pressure.  Patient states that he moved a refrigerator yesterday.  He states that after that time his blood pressure was elevated.  When he checked it this morning it was as high as [id number] systolic.  He states the lowest he saw was around [id number] systolic.  He felt a little lightheaded and off balance with walking, was able to ambulate without difficulty.  No syncope. Patient denies signs of stroke including: facial droop, slurred speech, aphasia, weakness/numbness in extremities, imbalance/trouble walking.  Denies severe headache, chest pain or shortness of breath.  Patient states that he lives alone and his elevated blood pressure was concerning, prompting emergency department visit.  Patient does relate some vague chest symptoms and discomfort which he attributes to a hiatal hernia which was diagnosed in the past.       Home Medications Prior to Admission medications   Medication Sig Start Date End Date Taking? Authorizing Provider  acetaminophen (TYLENOL) 500 MG tablet Take 500 mg by mouth every 6 (six) hours as needed (for pain.).    [provider]  Ginger, Zingiber officinalis, (GINGER PO) Take by mouth.    [provider]  HYDROcodone-acetaminophen (NORCO/VICODIN) 5-325 MG tablet Take 2 tablets by mouth every 4 (four) hours as needed. Patient not taking: Reported on 03/21/2017 12/15/16   Lorin Glass, PA-C  ibuprofen (ADVIL,MOTRIN) 800 MG tablet Take 1 tablet (800 mg total) by mouth 3 (three) times daily. 12/15/16   Lorin Glass, PA-C  lisinopril (PRINIVIL,ZESTRIL) 10 MG tablet Take 10  mg by mouth daily.  10/23/16   [provider]  Misc Natural Products (TART CHERRY ADVANCED PO) Take by mouth.    [provider]  Multiple Vitamin (MULTIVITAMIN WITH MINERALS) TABS tablet Take 1 tablet by mouth daily.    [provider]  Omega-3 Fatty Acids (FISH OIL) 1000 MG CAPS Take by mouth.    [provider]  ondansetron (ZOFRAN) 4 MG tablet Take 1 tablet (4 mg total) by mouth every 6 (six) hours. Patient not taking: Reported on 03/21/2017 12/15/16   Lorin Glass, PA-C  tamsulosin (FLOMAX) 0.4 MG CAPS capsule Take 1 capsule (0.4 mg total) by mouth daily. Patient not taking: Reported on 03/21/2017 12/15/16   Lorin Glass, PA-C  TURMERIC PO Take by mouth.    [provider]      Allergies    Patient has no known allergies.    Review of Systems   Review of Systems  Physical Exam Updated Vital Signs BP (!) 163/108 (BP Location: Right Arm)   Pulse 68   Temp 98.1 F (36.7 C) (Oral)   Resp 14   Ht '5\' 10"'$  (1.778 m)   Wt 92.5 kg   SpO2 99%   BMI 29.27 kg/m   Physical Exam Vitals and nursing note reviewed.  Constitutional:      Appearance: He is well-developed. He is not diaphoretic.  HENT:     Head: Normocephalic and atraumatic.     Nose: Nose normal.     Mouth/Throat:     Mouth: Mucous membranes are moist. Mucous  membranes are not dry.  Eyes:     Conjunctiva/sclera: Conjunctivae normal.  Neck:     Vascular: Normal carotid pulses. No carotid bruit or JVD.     Trachea: Trachea normal. No tracheal deviation.  Cardiovascular:     Rate and Rhythm: Normal rate and regular rhythm.     Pulses: No decreased pulses.          Radial pulses are 2+ on the right side and 2+ on the left side.     Heart sounds: Normal heart sounds, S1 normal and S2 normal. Heart sounds not distant. No murmur heard. Pulmonary:     Effort: Pulmonary effort is normal. No respiratory distress.     Breath sounds: Normal breath sounds. No wheezing.   Chest:     Chest wall: No tenderness.  Abdominal:     General: Bowel sounds are normal.     Palpations: Abdomen is soft.     Tenderness: There is no abdominal tenderness. There is no guarding or rebound.  Musculoskeletal:     Cervical back: Normal range of motion and neck supple. No muscular tenderness.     Right lower leg: No edema.     Left lower leg: No edema.  Skin:    General: Skin is warm and dry.     Coloration: Skin is not pale.  Neurological:     Mental Status: He is alert. Mental status is at baseline.  Psychiatric:        Mood and Affect: Mood is depressed.     ED Results / Procedures / Treatments   Labs (all labs ordered are listed, but only abnormal results are displayed) Labs Reviewed  BASIC METABOLIC PANEL - Abnormal; Notable for the following components:      Result Value   Glucose, Bld 122 (*)    All other components within normal limits  CBC    EKG None  Radiology No results found.  Procedures Procedures    Medications Ordered in ED Medications  lisinopril (ZESTRIL) tablet 10 mg (has no administration in time range)    ED Course/ Medical Decision Making/ A&P    Patient seen and examined. History obtained directly from patient.   Labs/EKG: Ordered CBC, BMP, EKG.  Imaging: None ordered  Medications/Fluids: None ordered  Most recent vital signs reviewed and are as follows: BP (!) 163/108 (BP Location: Right Arm)   Pulse 68   Temp 98.1 F (36.7 C) (Oral)   Resp 14   Ht '5\' 10"'$  (1.778 m)   Wt 92.5 kg   SpO2 99%   BMI 29.27 kg/m   Initial impression: Hypertension  1:01 PM Reassessment performed. Patient appears stable, comfortable.  Labs personally reviewed and interpreted including: CBC unremarkable; BMP unremarkable with normal creatinine at 0.85, glucose minimally elevated at 122.  EKG personally reviewed and interpreted, shows right bundle branch block, no old for comparison.  Otherwise no ST-T segment changes or T wave changes  to suggest ischemia.  Reviewed pertinent lab work and imaging with patient at bedside. Questions answered.   Most current vital signs reviewed and are as follows: BP (!) 163/108 (BP Location: Right Arm)   Pulse 68   Temp 98.1 F (36.7 C) (Oral)   Resp 14   Ht '5\' 10"'$  (1.778 m)   Wt 92.5 kg   SpO2 99%   BMI 29.27 kg/m   Plan: Discharge to home.   Prescriptions written for: None  Other home care instructions discussed: Patient to monitor blood pressure  at home.  If systolic blood pressure greater than [id number] or diastolic blood pressure greater than 90 approximately 2 hours after taking lisinopril, he will take a second dose.  ED return instructions discussed: Return with chest pain, shortness of breath, severe headache, strokelike symptoms, new or changing symptoms.  Follow-up instructions discussed: Patient encouraged to follow-up with their PCP in 7 days.                              Medical Decision Making Amount and/or Complexity of Data Reviewed Labs: ordered.  Risk Prescription drug management.   Patient is here today due to elevated blood pressure reading at home.  No strokelike symptoms.  Basic labs are unremarkable.  Blood pressure marginally improved in the emergency department.  Patient instructed to take additional dose of lisinopril with elevated blood pressures at home.  Low concern for significant endorgan damage today.  Kidney function is normal.  Patient did have some vague chest discomfort, likely related to GERD/hiatal hernia.  No other typical symptoms consistent with ACS.  Low concern for PE, dissection.  EKG with right bundle branch block, unclear chronicity.  The patient's vital signs, pertinent lab work and imaging were reviewed and interpreted as discussed in the ED course. Hospitalization was considered for further testing, treatments, or serial exams/observation. However as patient is well-appearing, has a stable exam, and reassuring studies today, I do not  feel that they warrant admission at this time. This plan was discussed with the patient who verbalizes agreement and comfort with this plan and seems reliable and able to return to the Emergency Department with worsening or changing symptoms.          Final Clinical Impression(s) / ED Diagnoses Final diagnoses:  Primary hypertension    Rx / DC Orders ED Discharge Orders     None         Carlisle Cater, PA-C 10/10/22 [place], DO 10/10/22 1455

## 2022-10-10 NOTE — ED Triage Notes (Signed)
Patient presents to ed via POV states he takes his b/p every am and today it was elevated and wouldn't come down. States his gait  felt a little unsteady and he knows that is when his blood pressure is up.

## 2022-10-17 DIAGNOSIS — E7800 Pure hypercholesterolemia, unspecified: Secondary | ICD-10-CM | POA: Diagnosis not present

## 2022-10-17 DIAGNOSIS — R69 Illness, unspecified: Secondary | ICD-10-CM | POA: Diagnosis not present

## 2022-10-17 DIAGNOSIS — Z6831 Body mass index (BMI) 31.0-31.9, adult: Secondary | ICD-10-CM | POA: Diagnosis not present

## 2022-10-17 DIAGNOSIS — I10 Essential (primary) hypertension: Secondary | ICD-10-CM | POA: Diagnosis not present

## 2022-10-21 ENCOUNTER — Encounter: Payer: Self-pay | Admitting: Cardiology

## 2022-10-21 ENCOUNTER — Ambulatory Visit: Payer: Medicare HMO | Admitting: Cardiology

## 2022-10-21 VITALS — BP 202/130 | HR 60 | Resp 16 | Ht 70.0 in | Wt 210.0 lb

## 2022-10-21 DIAGNOSIS — I4510 Unspecified right bundle-branch block: Secondary | ICD-10-CM | POA: Insufficient documentation

## 2022-10-21 DIAGNOSIS — I10 Essential (primary) hypertension: Secondary | ICD-10-CM | POA: Diagnosis not present

## 2022-10-21 MED ORDER — HYDRALAZINE HCL 50 MG PO TABS: 50.0000 mg | ORAL_TABLET | Freq: Three times a day (TID) | ORAL | 3 refills | Status: AC | PRN

## 2022-10-21 NOTE — Progress Notes (Signed)
Patient referred by Kathyrn Lass, MD for hypertension  Subjective:   Ronald Sosa, male    DOB: 1955/09/19, 67 y.o.   MRN: [id number]   Chief Complaint  Patient presents with   Dizziness   Chest Pain   Hypertension   RBBB   New Patient (Initial Visit)    HPI  68 y.o. Caucasian male with hypertension, RBBB  Patient was recently seen in ER for elevated blood pressure. He checks his blood pressure regularly. While for the most part, it stays within normal limits, he has significant variations with occasional spikes in his blood pressure.  To me, he denies any particular chest pain symptoms.  Dizziness occurs only when his blood pressure is elevated.  Past Medical History:  Diagnosis Date   Hypertension      Past Surgical History:  Procedure Laterality Date   dog bite     surgery on left leg    HAND SURGERY     LEG SURGERY Right    cyst removal    MANDIBLE FRACTURE SURGERY       Social History   Tobacco Use  Smoking Status Former   Types: Cigarettes   Quit date: 08/15/1976   Years since quitting: 46.2  Smokeless Tobacco Never    Social History   Substance and Sexual Activity  Alcohol Use No     Family History  Problem Relation Age of Onset   Arthritis Mother    Asthma Mother    Emphysema Father    Diabetes Sister    Cancer Brother        prostate    Hypertension Brother    Diabetes Brother      Current Outpatient Medications:    atorvastatin (LIPITOR) 20 MG tablet, Take 20 mg by mouth daily., Disp: , Rfl:    ibuprofen (ADVIL,MOTRIN) 800 MG tablet, Take 1 tablet (800 mg total) by mouth 3 (three) times daily., Disp: 21 tablet, Rfl: 0   lisinopril (PRINIVIL,ZESTRIL) 10 MG tablet, Take 10 mg by mouth daily. , Disp: , Rfl:    omeprazole (PRILOSEC) 20 MG capsule, Take 20 mg by mouth daily., Disp: , Rfl:    propranolol (INDERAL) 20 MG tablet, Take 20 mg by mouth daily., Disp: , Rfl:    Cardiovascular and other pertinent studies:  Reviewed  external labs and tests, independently interpreted  EKG 10/21/2022: Sinus rhythm 58 bpm Right bundle branch block   Recent labs: 10/10/2022: Glucose 122, BUN/Cr 11/0.85. EGFR >60. Na/K 137/3.9. Rest of the CMP normal H/H 15/46. MCV 91. Platelets 281    Review of Systems  Cardiovascular:  Negative for chest pain, dyspnea on exertion, leg swelling, palpitations and syncope.         Vitals:   10/21/22 1040 10/21/22 1041  BP:    Pulse:    Resp:    SpO2: 97% 96%   Orthostatic VS for the past 72 hrs (Last 3 readings):  Orthostatic BP Patient Position BP Location Cuff Size Orthostatic Pulse  10/21/22 1041 (!) 181/119 Standing Left Arm Normal 67  10/21/22 1040 (!) 175/107 Sitting Left Arm Normal 60  10/21/22 1039 (!) 161/107 Supine Left Arm Normal 58     Body mass index is 30.13 kg/m. Filed Weights   10/21/22 1034  Weight: 210 lb (95.3 kg)     Objective:   Physical Exam Vitals and nursing note reviewed.  Constitutional:      General: He is not in acute distress. Neck:  Vascular: No JVD.  Cardiovascular:     Rate and Rhythm: Normal rate and regular rhythm.     Heart sounds: Normal heart sounds. No murmur heard. Pulmonary:     Effort: Pulmonary effort is normal.     Breath sounds: Normal breath sounds. No wheezing or rales.  Musculoskeletal:     Right lower leg: No edema.     Left lower leg: No edema.         Visit diagnoses:   ICD-10-CM   1. RBBB  I45.10 EKG 12-Lead    2. Essential hypertension  I10 PCV ECHOCARDIOGRAM COMPLETE    hydrALAZINE (APRESOLINE) 50 MG tablet    PCV RENAL/RENAL ARTERY DUPLEX COMPLETE    TSH    Lipid panel    Aldosterone + renin activity w/ ratio    CANCELED: PCV CAROTID DUPLEX (BILATERAL)       Orders Placed This Encounter  Procedures   TSH   Lipid panel   Aldosterone + renin activity w/ ratio   EKG 12-Lead   PCV ECHOCARDIOGRAM COMPLETE   PCV RENAL/RENAL ARTERY DUPLEX COMPLETE     Meds ordered this encounter   Medications   hydrALAZINE (APRESOLINE) 50 MG tablet    Sig: Take 1 tablet (50 mg total) by mouth every 8 (eight) hours as needed.    Dispense:  270 tablet    Refill:  3     Assessment & Recommendations:   67 y.o. Caucasian male with labile hypertension, RBBB  No specific chest pain symptoms.  Dizziness only occurs with hypertension.  Orthostatics are negative.  Recommend lisinopril 10 mg daily, with addition of hydralazine 50 mg 3 times daily as needed with SBP >160 mmHg.  Will check echocardiogram, renal artery duplex, TSH, renal/aldosterone, lipid panel.  RBBB is incidental benign finding in the setting of hypertension.  Recommend follow-up with blood pressure monitor log, level of hydralazine use, as well as his blood pressure monitor to compare with office monitor.  Further recommendations after above testing.  Thank you for referring the patient to [place]. Please feel free to contact with any questions.   Nigel Mormon, MD Pager: [phone] Office: [phone]

## 2022-10-25 DIAGNOSIS — R972 Elevated prostate specific antigen [PSA]: Secondary | ICD-10-CM | POA: Diagnosis not present

## 2022-10-25 DIAGNOSIS — N201 Calculus of ureter: Secondary | ICD-10-CM | POA: Diagnosis not present

## 2022-10-26 ENCOUNTER — Ambulatory Visit: Payer: Medicare HMO

## 2022-10-26 DIAGNOSIS — I10 Essential (primary) hypertension: Secondary | ICD-10-CM

## 2022-10-27 LAB — ALDOSTERONE + RENIN ACTIVITY W/ RATIO
Aldos/Renin Ratio: 1 (ref 0.0–30.0)
Aldosterone: 2 ng/dL (ref 0.0–30.0)
Renin Activity, Plasma: 2.082 ng/mL/hr (ref 0.167–5.380)

## 2022-10-27 LAB — LIPID PANEL
Chol/HDL Ratio: 3.1 ratio (ref 0.0–5.0)
Cholesterol, Total: 135 mg/dL (ref 100–199)
HDL: 43 mg/dL (ref >39)
LDL Chol Calc (NIH): 72 mg/dL (ref 0–99)
Triglycerides: 109 mg/dL (ref 0–149)
VLDL Cholesterol Cal: 20 mg/dL (ref 5–40)

## 2022-10-27 LAB — TSH: TSH: 0.736 u[IU]/mL (ref 0.450–4.500)

## 2022-11-21 ENCOUNTER — Encounter: Payer: Self-pay | Admitting: Cardiology

## 2022-11-21 ENCOUNTER — Ambulatory Visit: Payer: Medicare HMO | Admitting: Cardiology

## 2022-11-21 VITALS — BP 159/95 | HR 70 | Ht 70.0 in | Wt 211.0 lb

## 2022-11-21 DIAGNOSIS — I10 Essential (primary) hypertension: Secondary | ICD-10-CM

## 2022-11-21 NOTE — Progress Notes (Signed)
Patient referred by Sigmund Hazel, MD for hypertension  Subjective:   Ronald Sosa, male    DOB: 1956/03/09, 67 y.o.   MRN: [id number]   Chief Complaint  Patient presents with   RBBB   Follow-up    4 weeks    HPI  67 y.o. Caucasian male with hypertension, RBBB  Reviewed recent test results with the patient, details below.   Blood pressure elevated in the office, on both home monitor and office monitor. Reportedly, BP dropped to 90/50 mmHg when he took lisinopril. He has not had any blood pressure spikes at [facility].  Initial consultation visit 10/2022: Patient was recently seen in ER for elevated blood pressure. He checks his blood pressure regularly. While for the most part, it stays within normal limits, he has significant variations with occasional spikes in his blood pressure.  To me, he denies any particular chest pain symptoms.  Dizziness occurs only when his blood pressure is elevated.  Current Outpatient Medications:    atorvastatin (LIPITOR) 20 MG tablet, Take 20 mg by mouth daily., Disp: , Rfl:    hydrALAZINE (APRESOLINE) 50 MG tablet, Take 1 tablet (50 mg total) by mouth every 8 (eight) hours as needed., Disp: 270 tablet, Rfl: 3   ibuprofen (ADVIL,MOTRIN) 800 MG tablet, Take 1 tablet (800 mg total) by mouth 3 (three) times daily., Disp: 21 tablet, Rfl: 0   lisinopril (PRINIVIL,ZESTRIL) 10 MG tablet, Take 10 mg by mouth daily. , Disp: , Rfl:    omeprazole (PRILOSEC) 20 MG capsule, Take 20 mg by mouth daily., Disp: , Rfl:    propranolol (INDERAL) 20 MG tablet, Take 20 mg by mouth daily., Disp: , Rfl:    Cardiovascular and other pertinent studies:  Reviewed external labs and tests, independently interpreted  EKG 10/21/2022: Sinus rhythm 58 bpm Right bundle branch block  Echocardiogram 10/26/2022: Normal LV systolic function with visual EF 55-60%. Left ventricle cavity is normal in size. Mild concentric hypertrophy of the left ventricle. Normal global wall motion.  Normal diastolic filling pattern, normal LAP. Left atrial cavity is slightly dilated. Structurally normal trileaflet aortic valve.  Mild (Grade I) aortic regurgitation. Structurally normal tricuspid valve.  Mild tricuspid regurgitation. No evidence of pulmonary hypertension. The aortic root is normal. Mildly dilated ascending aorta at 3.9 cm. No prior available for comparison.  Renal artery duplex 10/26/2022: No evidence of renal artery occlusive disease in either renal artery. Normal intrarenal vascular perfusion is noted in both kidneys. Renal length is within normal limits for both kidneys. Normal abdominal aorta flow velocities noted.   Recent labs: 10/21/2022: Chol 135, TG 109, HDL 43, LDL 72 Normal renin, aldosterone  10/10/2022: Glucose 122, BUN/Cr 11/0.85. EGFR >60. Na/K 137/3.9. Rest of the CMP normal H/H 15/46. MCV 91. Platelets 281    Review of Systems  Cardiovascular:  Negative for chest pain, dyspnea on exertion, leg swelling, palpitations and syncope.         Vitals:   11/21/22 0921  BP: (!) 159/95  Pulse: 70  SpO2: 98%   No data found.    Body mass index is 30.28 kg/m. Filed Weights   11/21/22 0921  Weight: 211 lb (95.7 kg)     Objective:   Physical Exam Vitals and nursing note reviewed.  Constitutional:      General: He is not in acute distress. Neck:     Vascular: No JVD.  Cardiovascular:     Rate and Rhythm: Normal rate and regular rhythm.  Heart sounds: Normal heart sounds. No murmur heard. Pulmonary:     Effort: Pulmonary effort is normal.     Breath sounds: Normal breath sounds. No wheezing or rales.  Musculoskeletal:     Right lower leg: No edema.     Left lower leg: No edema.         Visit diagnoses:   ICD-10-CM   1. White coat syndrome with hypertension  I10         Assessment & Recommendations:   67 y.o. Caucasian male with labile hypertension, RBBB  Suspect whie coat hypertension,. No recent spikes.  Okay to  hold off antihypertensive therapy at this time.     Elder Negus, MD Pager: [phone] Office: [phone]

## 2022-12-06 DIAGNOSIS — R972 Elevated prostate specific antigen [PSA]: Secondary | ICD-10-CM | POA: Diagnosis not present

## 2022-12-07 DIAGNOSIS — Z1211 Encounter for screening for malignant neoplasm of colon: Secondary | ICD-10-CM | POA: Diagnosis not present

## 2022-12-07 DIAGNOSIS — K219 Gastro-esophageal reflux disease without esophagitis: Secondary | ICD-10-CM | POA: Diagnosis not present

## 2022-12-13 DIAGNOSIS — R972 Elevated prostate specific antigen [PSA]: Secondary | ICD-10-CM | POA: Diagnosis not present

## 2022-12-13 DIAGNOSIS — N201 Calculus of ureter: Secondary | ICD-10-CM | POA: Diagnosis not present

## 2023-01-25 DIAGNOSIS — K640 First degree hemorrhoids: Secondary | ICD-10-CM | POA: Diagnosis not present

## 2023-01-25 DIAGNOSIS — K5730 Diverticulosis of large intestine without perforation or abscess without bleeding: Secondary | ICD-10-CM | POA: Diagnosis not present

## 2023-01-25 DIAGNOSIS — Z1211 Encounter for screening for malignant neoplasm of colon: Secondary | ICD-10-CM | POA: Diagnosis not present

## 2023-01-25 DIAGNOSIS — K635 Polyp of colon: Secondary | ICD-10-CM | POA: Diagnosis not present

## 2023-01-27 DIAGNOSIS — K635 Polyp of colon: Secondary | ICD-10-CM | POA: Diagnosis not present

## 2023-03-08 DIAGNOSIS — H52223 Regular astigmatism, bilateral: Secondary | ICD-10-CM | POA: Diagnosis not present

## 2023-03-08 DIAGNOSIS — Z135 Encounter for screening for eye and ear disorders: Secondary | ICD-10-CM | POA: Diagnosis not present

## 2023-03-08 DIAGNOSIS — H524 Presbyopia: Secondary | ICD-10-CM | POA: Diagnosis not present

## 2023-03-08 DIAGNOSIS — H5203 Hypermetropia, bilateral: Secondary | ICD-10-CM | POA: Diagnosis not present

## 2023-03-15 DIAGNOSIS — L570 Actinic keratosis: Secondary | ICD-10-CM | POA: Diagnosis not present

## 2023-03-15 DIAGNOSIS — D2371 Other benign neoplasm of skin of right lower limb, including hip: Secondary | ICD-10-CM | POA: Diagnosis not present

## 2023-03-15 DIAGNOSIS — X32XXXD Exposure to sunlight, subsequent encounter: Secondary | ICD-10-CM | POA: Diagnosis not present

## 2023-07-11 DIAGNOSIS — X32XXXD Exposure to sunlight, subsequent encounter: Secondary | ICD-10-CM | POA: Diagnosis not present

## 2023-07-11 DIAGNOSIS — L570 Actinic keratosis: Secondary | ICD-10-CM | POA: Diagnosis not present

## 2023-07-19 DIAGNOSIS — I10 Essential (primary) hypertension: Secondary | ICD-10-CM | POA: Diagnosis not present

## 2023-07-19 DIAGNOSIS — E7800 Pure hypercholesterolemia, unspecified: Secondary | ICD-10-CM | POA: Diagnosis not present

## 2023-10-25 DIAGNOSIS — L570 Actinic keratosis: Secondary | ICD-10-CM | POA: Diagnosis not present

## 2023-10-25 DIAGNOSIS — X32XXXD Exposure to sunlight, subsequent encounter: Secondary | ICD-10-CM | POA: Diagnosis not present

## 2024-07-19 ENCOUNTER — Other Ambulatory Visit: Payer: Self-pay | Admitting: Family Medicine

## 2024-07-19 DIAGNOSIS — I77810 Thoracic aortic ectasia: Secondary | ICD-10-CM

## 2024-07-25 ENCOUNTER — Inpatient Hospital Stay: Admission: RE | Admit: 2024-07-25 | Discharge: 2024-07-25 | Attending: Family Medicine | Admitting: Family Medicine

## 2024-07-25 DIAGNOSIS — I77810 Thoracic aortic ectasia: Secondary | ICD-10-CM

## 2024-07-25 MED ORDER — IOPAMIDOL (ISOVUE-370) INJECTION 76%
75.0000 mL | Freq: Once | INTRAVENOUS | Status: AC | PRN
  Administered 2024-07-25: 13:00:00 75 mL via INTRAVENOUS
